# Patient Record
Sex: Male | Born: 1940 | Race: White | Hispanic: No | Smoking: Never smoker
Health system: Southern US, Community
[De-identification: ages and names within clinical notes are randomized; demographics above are authoritative.]

## PROBLEM LIST (undated history)

## (undated) DIAGNOSIS — F0390 Unspecified dementia without behavioral disturbance: Secondary | ICD-10-CM

## (undated) DIAGNOSIS — E785 Hyperlipidemia, unspecified: Secondary | ICD-10-CM

## (undated) DIAGNOSIS — I959 Hypotension, unspecified: Secondary | ICD-10-CM

## (undated) DIAGNOSIS — D649 Anemia, unspecified: Secondary | ICD-10-CM

---

## 2020-01-01 ENCOUNTER — Other Ambulatory Visit: Payer: Self-pay

## 2020-01-01 ENCOUNTER — Emergency Department (HOSPITAL_COMMUNITY): Payer: Medicare Other

## 2020-01-01 ENCOUNTER — Inpatient Hospital Stay (HOSPITAL_COMMUNITY)
Admission: EM | Admit: 2020-01-01 | Discharge: 2020-01-04 | DRG: 871 | Disposition: A | Discharge status: Nursing Home (Includes ICF) | Payer: Medicare Other | Source: Skilled Nursing Facility | Attending: Internal Medicine | Admitting: Internal Medicine

## 2020-01-01 DIAGNOSIS — G309 Alzheimer's disease, unspecified: Secondary | ICD-10-CM | POA: Diagnosis present

## 2020-01-01 DIAGNOSIS — Z66 Do not resuscitate: Secondary | ICD-10-CM | POA: Diagnosis present

## 2020-01-01 DIAGNOSIS — E876 Hypokalemia: Secondary | ICD-10-CM | POA: Diagnosis present

## 2020-01-01 DIAGNOSIS — Z7189 Other specified counseling: Secondary | ICD-10-CM | POA: Diagnosis not present

## 2020-01-01 DIAGNOSIS — J189 Pneumonia, unspecified organism: Secondary | ICD-10-CM | POA: Diagnosis present

## 2020-01-01 DIAGNOSIS — G9341 Metabolic encephalopathy: Secondary | ICD-10-CM | POA: Diagnosis present

## 2020-01-01 DIAGNOSIS — F0390 Unspecified dementia without behavioral disturbance: Secondary | ICD-10-CM | POA: Diagnosis present

## 2020-01-01 DIAGNOSIS — J690 Pneumonitis due to inhalation of food and vomit: Secondary | ICD-10-CM | POA: Diagnosis present

## 2020-01-01 DIAGNOSIS — Z515 Encounter for palliative care: Secondary | ICD-10-CM | POA: Diagnosis not present

## 2020-01-01 DIAGNOSIS — E785 Hyperlipidemia, unspecified: Secondary | ICD-10-CM | POA: Diagnosis present

## 2020-01-01 DIAGNOSIS — F0281 Dementia in other diseases classified elsewhere with behavioral disturbance: Secondary | ICD-10-CM | POA: Diagnosis not present

## 2020-01-01 DIAGNOSIS — F0391 Unspecified dementia with behavioral disturbance: Secondary | ICD-10-CM | POA: Diagnosis not present

## 2020-01-01 DIAGNOSIS — R410 Disorientation, unspecified: Secondary | ICD-10-CM | POA: Diagnosis not present

## 2020-01-01 DIAGNOSIS — A419 Sepsis, unspecified organism: Principal | ICD-10-CM | POA: Diagnosis present

## 2020-01-01 DIAGNOSIS — Z20822 Contact with and (suspected) exposure to covid-19: Secondary | ICD-10-CM | POA: Diagnosis present

## 2020-01-01 DIAGNOSIS — G301 Alzheimer's disease with late onset: Secondary | ICD-10-CM | POA: Diagnosis not present

## 2020-01-01 DIAGNOSIS — G9340 Encephalopathy, unspecified: Secondary | ICD-10-CM | POA: Diagnosis not present

## 2020-01-01 DIAGNOSIS — F02818 Dementia in other diseases classified elsewhere, unspecified severity, with other behavioral disturbance: Secondary | ICD-10-CM

## 2020-01-01 DIAGNOSIS — F0280 Dementia in other diseases classified elsewhere without behavioral disturbance: Secondary | ICD-10-CM | POA: Diagnosis present

## 2020-01-01 DIAGNOSIS — R1312 Dysphagia, oropharyngeal phase: Secondary | ICD-10-CM | POA: Diagnosis present

## 2020-01-01 DIAGNOSIS — G4700 Insomnia, unspecified: Secondary | ICD-10-CM | POA: Diagnosis present

## 2020-01-01 DIAGNOSIS — R509 Fever, unspecified: Secondary | ICD-10-CM

## 2020-01-01 DIAGNOSIS — D649 Anemia, unspecified: Secondary | ICD-10-CM | POA: Diagnosis present

## 2020-01-01 HISTORY — DX: Unspecified dementia, unspecified severity, without behavioral disturbance, psychotic disturbance, mood disturbance, and anxiety: F03.90

## 2020-01-01 LAB — URINALYSIS, ROUTINE W REFLEX MICROSCOPIC
Bilirubin Urine: NEGATIVE
Glucose, UA: NEGATIVE mg/dL
Ketones, ur: 5 mg/dL — AB
Leukocytes,Ua: NEGATIVE
Nitrite: NEGATIVE
Protein, ur: 100 mg/dL — AB
Specific Gravity, Urine: 1.016 (ref 1.005–1.030)
pH: 8 (ref 5.0–8.0)

## 2020-01-01 LAB — COMPREHENSIVE METABOLIC PANEL
ALT: 33 U/L (ref 0–44)
AST: 59 U/L — ABNORMAL HIGH (ref 15–41)
Albumin: 3.6 g/dL (ref 3.5–5.0)
Alkaline Phosphatase: 92 U/L (ref 38–126)
Anion gap: 15 (ref 5–15)
BUN: 21 mg/dL (ref 8–23)
CO2: 26 mmol/L (ref 22–32)
Calcium: 9.2 mg/dL (ref 8.9–10.3)
Chloride: 101 mmol/L (ref 98–111)
Creatinine, Ser: 0.97 mg/dL (ref 0.61–1.24)
GFR calc Af Amer: >60 mL/min (ref >60)
GFR calc non Af Amer: >60 mL/min (ref >60)
Glucose, Bld: 109 mg/dL — ABNORMAL HIGH (ref 70–99)
Potassium: 3.9 mmol/L (ref 3.5–5.1)
Sodium: 142 mmol/L (ref 135–145)
Total Bilirubin: 0.9 mg/dL (ref 0.3–1.2)
Total Protein: 6.7 g/dL (ref 6.5–8.1)

## 2020-01-01 LAB — CBC WITH DIFFERENTIAL/PLATELET
Abs Immature Granulocytes: 0.1 10*3/uL — ABNORMAL HIGH (ref 0.00–0.07)
Basophils Absolute: 0.1 10*3/uL (ref 0.0–0.1)
Basophils Relative: 0 %
Eosinophils Absolute: 0 10*3/uL (ref 0.0–0.5)
Eosinophils Relative: 0 %
HCT: 39 % (ref 39.0–52.0)
Hemoglobin: 12.9 g/dL — ABNORMAL LOW (ref 13.0–17.0)
Immature Granulocytes: 1 %
Lymphocytes Relative: 5 %
Lymphs Abs: 0.8 10*3/uL (ref 0.7–4.0)
MCH: 32.1 pg (ref 26.0–34.0)
MCHC: 33.1 g/dL (ref 30.0–36.0)
MCV: 97 fL (ref 80.0–100.0)
Monocytes Absolute: 1.2 10*3/uL — ABNORMAL HIGH (ref 0.1–1.0)
Monocytes Relative: 7 %
Neutro Abs: 13.7 10*3/uL — ABNORMAL HIGH (ref 1.7–7.7)
Neutrophils Relative %: 87 %
Platelets: 157 10*3/uL (ref 150–400)
RBC: 4.02 MIL/uL — ABNORMAL LOW (ref 4.22–5.81)
RDW: 12.6 % (ref 11.5–15.5)
WBC: 15.8 10*3/uL — ABNORMAL HIGH (ref 4.0–10.5)
nRBC: 0 % (ref 0.0–0.2)

## 2020-01-01 LAB — PROTIME-INR
INR: 1.1 (ref 0.8–1.2)
Prothrombin Time: 13.8 seconds (ref 11.4–15.2)

## 2020-01-01 LAB — RESPIRATORY PANEL BY RT PCR (FLU A&B, COVID)
Influenza A by PCR: NEGATIVE
Influenza B by PCR: NEGATIVE
SARS Coronavirus 2 by RT PCR: NEGATIVE

## 2020-01-01 LAB — LACTIC ACID, PLASMA: Lactic Acid, Venous: 2.1 mmol/L (ref 0.5–1.9)

## 2020-01-01 LAB — APTT: aPTT: 31 seconds (ref 24–36)

## 2020-01-01 MED ORDER — SODIUM CHLORIDE 0.9 % IV SOLN
1.0000 g | Freq: Every day | INTRAVENOUS | Status: DC
  Administered 2020-01-01: 1 g via INTRAVENOUS
  Filled 2020-01-01: qty 10

## 2020-01-01 MED ORDER — LACTATED RINGERS IV BOLUS (SEPSIS)
500.0000 mL | Freq: Once | INTRAVENOUS | Status: AC
  Administered 2020-01-02: 500 mL via INTRAVENOUS

## 2020-01-01 MED ORDER — SODIUM CHLORIDE 0.9 % IV SOLN
500.0000 mg | Freq: Once | INTRAVENOUS | Status: AC
  Administered 2020-01-01: 500 mg via INTRAVENOUS
  Filled 2020-01-01: qty 500

## 2020-01-01 MED ORDER — LACTATED RINGERS IV BOLUS (SEPSIS)
1000.0000 mL | Freq: Once | INTRAVENOUS | Status: AC
  Administered 2020-01-01: 1000 mL via INTRAVENOUS

## 2020-01-01 MED ORDER — IBUPROFEN 400 MG PO TABS
600.0000 mg | ORAL_TABLET | Freq: Once | ORAL | Status: DC
  Filled 2020-01-01: qty 1

## 2020-01-01 MED ORDER — SODIUM CHLORIDE 0.9 % IV BOLUS: 1000.0000 mL | Freq: Once | INTRAVENOUS | Status: DC

## 2020-01-01 MED ORDER — ACETAMINOPHEN 325 MG RE SUPP
325.0000 mg | Freq: Once | RECTAL | Status: AC
  Administered 2020-01-01: 325 mg via RECTAL
  Filled 2020-01-01: qty 1

## 2020-01-01 MED ORDER — ACETAMINOPHEN 650 MG RE SUPP
650.0000 mg | Freq: Once | RECTAL | Status: AC
  Administered 2020-01-01: 22:00:00 650 mg via RECTAL
  Filled 2020-01-01: qty 1

## 2020-01-01 NOTE — ED Provider Notes (Signed)
[facility] EMERGENCY DEPARTMENT Provider Note   CSN: [id number] Arrival date & time: 01/01/20  2111     History Chief Complaint  Patient presents with  . Altered Mental Status  . Fever    Raymond Snyder is a 79 y.o. male.  Pt presents to the ED today from the SNF with fever and AMS.  Pt has a hx of dementia and is unable to given any hx.  EMS said the SNF did not give much info.    I called the SNF [facility]) and spoke to his nurse.  She said he c/o feeling tired this am.  Around 2015, the nurse checked on him and he had a fever.  No meds for his fever. The pt was tested for covid yesterday, but results are not back.  Pt's nurse thinks he's had his Covid vaccine, but is not sure.  Pt's daughter is here and said he's NOT had his Covid vaccine.  Pt's daughter said he is normally very talkative, but is not speaking now.        No past medical history on file.  There are no problems to display for this patient.  Past Medical History:  Diagnosis Date  . Alzheimer's disease (HCC)  . Constipation  . Depression with anxiety  . Hallucinations  . Insomnia    Surgery: . ABDOMINAL AORTIC ANEURYSM REPAIR     No family history on file.  Social History   Tobacco Use  . Smoking status: Not on file  Substance Use Topics  . Alcohol use: Not on file  . Drug use: Not on file   Former smoker Lives in ALF  Home Medications Prior to Admission medications   Not on File    Allergies    Patient has no allergy information on record. nkda  Review of Systems   Review of Systems  Unable to perform ROS: Dementia    Physical Exam Updated Vital Signs BP (!) 165/82   Pulse 89   Temp (!) 101.9 F (38.8 C) (Rectal)   Resp 20   Ht 5\' 10"  (1.778 m)   Wt 79.8 kg   SpO2 96%   BMI 25.25 kg/m   Physical Exam Vitals and nursing note reviewed.  HENT:     Head: Normocephalic and atraumatic.     Right Ear: External ear normal.     Left Ear: External  ear normal.     Nose: Nose normal.     Mouth/Throat:     Mouth: Mucous membranes are dry.  Eyes:     Extraocular Movements: Extraocular movements intact.     Conjunctiva/sclera: Conjunctivae normal.     Pupils: Pupils are equal, round, and reactive to light.  Cardiovascular:     Rate and Rhythm: Normal rate and regular rhythm.     Pulses: Normal pulses.     Heart sounds: Normal heart sounds.  Pulmonary:     Effort: Pulmonary effort is normal.     Breath sounds: Normal breath sounds.  Abdominal:     General: Abdomen is flat. Bowel sounds are normal.     Palpations: Abdomen is soft.  Musculoskeletal:        General: Normal range of motion.     Cervical back: Normal range of motion and neck supple.  Skin:    General: Skin is warm.     Capillary Refill: Capillary refill takes less than 2 seconds.  Neurological:     Mental Status: He is disoriented.  Comments: Pt will not answer any questions or follow any commands.  He is moving all 4 extremities.  Psychiatric:        Mood and Affect: Mood is anxious.     ED Results / Procedures / Treatments   Labs (all labs ordered are listed, but only abnormal results are displayed) Labs Reviewed  LACTIC ACID, PLASMA - Abnormal; Notable for the following components:      Result Value   Lactic Acid, Venous 2.1 (*)    All other components within normal limits  COMPREHENSIVE METABOLIC PANEL - Abnormal; Notable for the following components:   Glucose, Bld 109 (*)    AST 59 (*)    All other components within normal limits  CBC WITH DIFFERENTIAL/PLATELET - Abnormal; Notable for the following components:   WBC 15.8 (*)    RBC 4.02 (*)    Hemoglobin 12.9 (*)    Neutro Abs 13.7 (*)    Monocytes Absolute 1.2 (*)    Abs Immature Granulocytes 0.10 (*)    All other components within normal limits  URINALYSIS, ROUTINE W REFLEX MICROSCOPIC - Abnormal; Notable for the following components:   Hgb urine dipstick MODERATE (*)    Ketones, ur 5 (*)     Protein, ur 100 (*)    Bacteria, UA RARE (*)    All other components within normal limits  RESPIRATORY PANEL BY RT PCR (FLU A&B, COVID)  CULTURE, BLOOD (ROUTINE X 2)  CULTURE, BLOOD (ROUTINE X 2)  URINE CULTURE  APTT  PROTIME-INR  LACTIC ACID, PLASMA    EKG EKG Interpretation  Date/Time:  Wednesday January 01 2020 21:15:40 EDT Ventricular Rate:  91 PR Interval:    QRS Duration: 154 QT Interval:  425 QTC Calculation: 523 R Axis:   -77 Text Interpretation: Sinus rhythm Probable left atrial enlargement RBBB and LAFB Left ventricular hypertrophy No old tracing to compare Confirmed by Isla Pence [phone]) on 01/01/2020 11:15:03 PM   Radiology DG Chest Port 1 View  Result Date: 01/01/2020 CLINICAL DATA:  Fever EXAM: PORTABLE CHEST 1 VIEW COMPARISON:  None. FINDINGS: Left lung grossly clear. Airspace disease at the right middle lobe and right base. Normal heart size. No pneumothorax IMPRESSION: Airspace disease at the right middle lobe and right base concerning for pneumonia or aspiration. Radiographic follow-up to resolution is recommended Electronically Signed   By: Donavan Foil M.D.   On: 01/01/2020 22:01    Procedures Procedures (including critical care time)  Medications Ordered in ED Medications  cefTRIAXone (ROCEPHIN) 1 g in sodium chloride 0.9 % 100 mL IVPB (0 g Intravenous Stopped 01/01/20 2304)  azithromycin (ZITHROMAX) 500 mg in sodium chloride 0.9 % 250 mL IVPB (500 mg Intravenous New Bag/Given 01/01/20 2304)  lactated ringers bolus 1,000 mL (has no administration in time range)    And  lactated ringers bolus 1,000 mL (has no administration in time range)    And  lactated ringers bolus 500 mL (has no administration in time range)  ibuprofen (ADVIL) tablet 600 mg (has no administration in time range)  acetaminophen (TYLENOL) suppository 650 mg (650 mg Rectal Given 01/01/20 2211)  acetaminophen (TYLENOL) suppository 325 mg (325 mg Rectal Given 01/01/20 2211)    ED  Course  I have reviewed the triage vital signs and the nursing notes.  Pertinent labs & imaging results that were available during my care of the patient were reviewed by me and considered in my medical decision making (see chart for details).    MDM  Rules/Calculators/A&P                      Code sepsis ordered due to fever, rr, and delirium.  Pt does have pna.  He was given rocephin and zithromax.    Pt given sepsis fluids.  Pt is still very confused.  Pt d/w Dr. Toniann Fail (triad) for admission.  CRITICAL CARE Performed by: Jacalyn Lefevre   Total critical care time: 30 minutes  Critical care time was exclusive of separately billable procedures and treating other patients.  Critical care was necessary to treat or prevent imminent or life-threatening deterioration.  Critical care was time spent personally by me on the following activities: development of treatment plan with patient and/or surrogate as well as nursing, discussions with consultants, evaluation of patient's response to treatment, examination of patient, obtaining history from patient or surrogate, ordering and performing treatments and interventions, ordering and review of laboratory studies, ordering and review of radiographic studies, pulse oximetry and re-evaluation of patient's condition.  Ketih Goodie was evaluated in Emergency Department on 01/01/2020 for the symptoms described in the history of present illness. He was evaluated in the context of the global COVID-19 pandemic, which necessitated consideration that the patient might be at risk for infection with the SARS-CoV-2 virus that causes COVID-19. Institutional protocols and algorithms that pertain to the evaluation of patients at risk for COVID-19 are in a state of rapid change based on information released by regulatory bodies including the CDC and federal and state organizations. These policies and algorithms were followed during the patient's care in the ED.    Final Clinical Impression(s) / ED Diagnoses Final diagnoses:  Community acquired pneumonia of right lower lobe of lung  Acute delirium  COVID-19 virus not detected  Fever in adult    Rx / DC Orders ED Discharge Orders    None       Jacalyn Lefevre, MD 01/01/20 2340

## 2020-01-01 NOTE — ED Triage Notes (Signed)
Pt BIB [facility] from [facility] c/o fevers and increased AMS. Pt altered at baseline with minimal communication. HX dementia. Currently A&Ox0. unknow LKN.   EMS VS T 101.3 HR 97 BP 171/93 SpO2 94 RA CBG 115

## 2020-01-02 ENCOUNTER — Encounter (HOSPITAL_COMMUNITY): Payer: Self-pay | Admitting: Internal Medicine

## 2020-01-02 ENCOUNTER — Inpatient Hospital Stay (HOSPITAL_COMMUNITY): Payer: Medicare Other

## 2020-01-02 DIAGNOSIS — J189 Pneumonia, unspecified organism: Secondary | ICD-10-CM

## 2020-01-02 DIAGNOSIS — G9340 Encephalopathy, unspecified: Secondary | ICD-10-CM

## 2020-01-02 DIAGNOSIS — F0391 Unspecified dementia with behavioral disturbance: Secondary | ICD-10-CM

## 2020-01-02 DIAGNOSIS — A419 Sepsis, unspecified organism: Principal | ICD-10-CM

## 2020-01-02 LAB — CBC WITH DIFFERENTIAL/PLATELET
Abs Immature Granulocytes: 0.09 K/uL — ABNORMAL HIGH (ref 0.00–0.07)
Basophils Absolute: 0 K/uL (ref 0.0–0.1)
Basophils Relative: 0 %
Eosinophils Absolute: 0 K/uL (ref 0.0–0.5)
Eosinophils Relative: 0 %
HCT: 34.1 % — ABNORMAL LOW (ref 39.0–52.0)
Hemoglobin: 11.6 g/dL — ABNORMAL LOW (ref 13.0–17.0)
Immature Granulocytes: 1 %
Lymphocytes Relative: 4 %
Lymphs Abs: 0.6 K/uL — ABNORMAL LOW (ref 0.7–4.0)
MCH: 32.5 pg (ref 26.0–34.0)
MCHC: 34 g/dL (ref 30.0–36.0)
MCV: 95.5 fL (ref 80.0–100.0)
Monocytes Absolute: 0.9 K/uL (ref 0.1–1.0)
Monocytes Relative: 7 %
Neutro Abs: 12.8 K/uL — ABNORMAL HIGH (ref 1.7–7.7)
Neutrophils Relative %: 88 %
Platelets: 131 K/uL — ABNORMAL LOW (ref 150–400)
RBC: 3.57 MIL/uL — ABNORMAL LOW (ref 4.22–5.81)
RDW: 12.4 % (ref 11.5–15.5)
WBC: 14.4 K/uL — ABNORMAL HIGH (ref 4.0–10.5)
nRBC: 0 % (ref 0.0–0.2)

## 2020-01-02 LAB — COMPREHENSIVE METABOLIC PANEL WITH GFR
ALT: 29 U/L (ref 0–44)
AST: 54 U/L — ABNORMAL HIGH (ref 15–41)
Albumin: 2.8 g/dL — ABNORMAL LOW (ref 3.5–5.0)
Alkaline Phosphatase: 85 U/L (ref 38–126)
Anion gap: 12 (ref 5–15)
BUN: 16 mg/dL (ref 8–23)
CO2: 27 mmol/L (ref 22–32)
Calcium: 8.6 mg/dL — ABNORMAL LOW (ref 8.9–10.3)
Chloride: 101 mmol/L (ref 98–111)
Creatinine, Ser: 0.84 mg/dL (ref 0.61–1.24)
GFR calc Af Amer: >60 mL/min
GFR calc non Af Amer: >60 mL/min
Glucose, Bld: 107 mg/dL — ABNORMAL HIGH (ref 70–99)
Potassium: 3.4 mmol/L — ABNORMAL LOW (ref 3.5–5.1)
Sodium: 140 mmol/L (ref 135–145)
Total Bilirubin: 0.7 mg/dL (ref 0.3–1.2)
Total Protein: 5.5 g/dL — ABNORMAL LOW (ref 6.5–8.1)

## 2020-01-02 LAB — LACTIC ACID, PLASMA
Lactic Acid, Venous: 1.6 mmol/L (ref 0.5–1.9)
Lactic Acid, Venous: 1 mmol/L (ref 0.5–1.9)

## 2020-01-02 LAB — STREP PNEUMONIAE URINARY ANTIGEN: Strep Pneumo Urinary Antigen: NEGATIVE

## 2020-01-02 LAB — GLUCOSE, CAPILLARY
Glucose-Capillary: 93 mg/dL (ref 70–99)
Glucose-Capillary: 106 mg/dL — ABNORMAL HIGH (ref 70–99)

## 2020-01-02 MED ORDER — ORAL CARE MOUTH RINSE
15.0000 mL | Freq: Two times a day (BID) | OROMUCOSAL | Status: DC
  Administered 2020-01-03 – 2020-01-04 (×3): 15 mL via OROMUCOSAL

## 2020-01-02 MED ORDER — HALOPERIDOL 5 MG PO TABS
7.5000 mg | ORAL_TABLET | Freq: Every day | ORAL | Status: DC
  Administered 2020-01-02 – 2020-01-03 (×2): 7.5 mg via ORAL
  Filled 2020-01-02 (×4): qty 1

## 2020-01-02 MED ORDER — ACETAMINOPHEN 650 MG RE SUPP: 650.0000 mg | Freq: Four times a day (QID) | RECTAL | Status: DC | PRN

## 2020-01-02 MED ORDER — CHLORHEXIDINE GLUCONATE 0.12 % MT SOLN
15.0000 mL | Freq: Two times a day (BID) | OROMUCOSAL | Status: DC
  Administered 2020-01-02 – 2020-01-04 (×4): 15 mL via OROMUCOSAL
  Filled 2020-01-02 (×4): qty 15

## 2020-01-02 MED ORDER — SODIUM CHLORIDE 0.9 % IV SOLN: INTRAVENOUS | Status: DC

## 2020-01-02 MED ORDER — ONDANSETRON HCL 4 MG/2ML IJ SOLN: 4.0000 mg | Freq: Four times a day (QID) | INTRAMUSCULAR | Status: DC | PRN

## 2020-01-02 MED ORDER — ASPIRIN EC 81 MG PO TBEC
81.0000 mg | DELAYED_RELEASE_TABLET | Freq: Every day | ORAL | Status: DC
  Administered 2020-01-02 – 2020-01-04 (×3): 81 mg via ORAL
  Filled 2020-01-02 (×3): qty 1

## 2020-01-02 MED ORDER — ATORVASTATIN CALCIUM 80 MG PO TABS
80.0000 mg | ORAL_TABLET | Freq: Every day | ORAL | Status: DC
  Administered 2020-01-02 – 2020-01-03 (×2): 80 mg via ORAL
  Filled 2020-01-02 (×2): qty 1

## 2020-01-02 MED ORDER — POTASSIUM CHLORIDE CRYS ER 20 MEQ PO TBCR
40.0000 meq | EXTENDED_RELEASE_TABLET | Freq: Once | ORAL | Status: AC
  Administered 2020-01-02: 40 meq via ORAL
  Filled 2020-01-02: qty 2

## 2020-01-02 MED ORDER — CLONAZEPAM 0.25 MG PO TBDP
0.2500 mg | ORAL_TABLET | Freq: Every day | ORAL | Status: DC
  Administered 2020-01-02 – 2020-01-03 (×2): 0.25 mg via ORAL
  Filled 2020-01-02 (×2): qty 1

## 2020-01-02 MED ORDER — MEMANTINE HCL 10 MG PO TABS
10.0000 mg | ORAL_TABLET | Freq: Two times a day (BID) | ORAL | Status: DC
  Administered 2020-01-02 – 2020-01-04 (×5): 10 mg via ORAL
  Filled 2020-01-02 (×5): qty 1

## 2020-01-02 MED ORDER — HALOPERIDOL 5 MG PO TABS: 5.0000 mg | ORAL_TABLET | ORAL | Status: DC

## 2020-01-02 MED ORDER — CLONAZEPAM 0.5 MG PO TABS: 0.2500 mg | ORAL_TABLET | Freq: Every day | ORAL | Status: DC

## 2020-01-02 MED ORDER — SODIUM CHLORIDE 0.9 % IV SOLN
2.0000 g | Freq: Every day | INTRAVENOUS | Status: DC
  Administered 2020-01-02 – 2020-01-03 (×2): 2 g via INTRAVENOUS
  Filled 2020-01-02 (×2): qty 20

## 2020-01-02 MED ORDER — ONDANSETRON HCL 4 MG PO TABS: 4.0000 mg | ORAL_TABLET | Freq: Four times a day (QID) | ORAL | Status: DC | PRN

## 2020-01-02 MED ORDER — HALOPERIDOL LACTATE 5 MG/ML IJ SOLN
1.0000 mg | Freq: Four times a day (QID) | INTRAMUSCULAR | Status: DC | PRN
  Administered 2020-01-02: 1 mg via INTRAVENOUS
  Filled 2020-01-02 (×2): qty 1

## 2020-01-02 MED ORDER — LORAZEPAM 2 MG/ML IJ SOLN: 0.5000 mg | Freq: Four times a day (QID) | INTRAMUSCULAR | Status: DC | PRN

## 2020-01-02 MED ORDER — HALOPERIDOL 5 MG PO TABS
5.0000 mg | ORAL_TABLET | Freq: Two times a day (BID) | ORAL | Status: DC
  Administered 2020-01-02 – 2020-01-04 (×4): 5 mg via ORAL
  Filled 2020-01-02 (×5): qty 1

## 2020-01-02 MED ORDER — TRAZODONE HCL 50 MG PO TABS
100.0000 mg | ORAL_TABLET | Freq: Every day | ORAL | Status: DC
  Administered 2020-01-02 – 2020-01-03 (×2): 100 mg via ORAL
  Filled 2020-01-02 (×2): qty 2

## 2020-01-02 MED ORDER — HALOPERIDOL LACTATE 5 MG/ML IJ SOLN
1.0000 mg | Freq: Once | INTRAMUSCULAR | Status: AC
  Administered 2020-01-02: 1 mg via INTRAVENOUS
  Filled 2020-01-02: qty 1

## 2020-01-02 MED ORDER — CARBAMAZEPINE 200 MG PO TABS
200.0000 mg | ORAL_TABLET | Freq: Three times a day (TID) | ORAL | Status: DC
  Administered 2020-01-02 – 2020-01-04 (×6): 200 mg via ORAL
  Filled 2020-01-02 (×9): qty 1

## 2020-01-02 MED ORDER — ENOXAPARIN SODIUM 40 MG/0.4ML ~~LOC~~ SOLN
40.0000 mg | SUBCUTANEOUS | Status: DC
  Administered 2020-01-02 – 2020-01-04 (×3): 40 mg via SUBCUTANEOUS
  Filled 2020-01-02 (×3): qty 0.4

## 2020-01-02 MED ORDER — SODIUM CHLORIDE 0.9 % IV SOLN
500.0000 mg | Freq: Every day | INTRAVENOUS | Status: DC
  Administered 2020-01-02 – 2020-01-03 (×2): 500 mg via INTRAVENOUS
  Filled 2020-01-02 (×3): qty 500

## 2020-01-02 MED ORDER — ACETAMINOPHEN 325 MG PO TABS
650.0000 mg | ORAL_TABLET | Freq: Four times a day (QID) | ORAL | Status: DC | PRN
  Administered 2020-01-02: 650 mg via ORAL
  Filled 2020-01-02: qty 2

## 2020-01-02 MED ORDER — OLANZAPINE 2.5 MG PO TABS
2.5000 mg | ORAL_TABLET | Freq: Two times a day (BID) | ORAL | Status: DC | PRN
  Administered 2020-01-03: 2.5 mg via ORAL
  Filled 2020-01-02 (×3): qty 1

## 2020-01-02 MED ORDER — HYDRALAZINE HCL 20 MG/ML IJ SOLN
5.0000 mg | Freq: Four times a day (QID) | INTRAMUSCULAR | Status: DC | PRN
  Administered 2020-01-02 (×2): 5 mg via INTRAVENOUS
  Filled 2020-01-02: qty 1

## 2020-01-02 NOTE — ED Notes (Signed)
Safety mittens placed on pt. Family at bedside

## 2020-01-02 NOTE — ED Notes (Signed)
Please call patient's daughter Lorina Rabon) on admission to floor, with updates,  and with any questions.

## 2020-01-02 NOTE — Evaluation (Signed)
Physical Therapy Evaluation and Discharge Patient Details Name: Raymond Snyder MRN: [id number] DOB: 1940/11/16 Today's Date: 01/02/2020   History of Present Illness  79 y.o. male presents to ED from [facility] with fever and AMS. Known history of dementia and hyperlipidemia. Chest x-ray shows infiltrates concerning for pneumonia. Admitted 01/01/20 for treatment of CAP and acute encephalopathy  Clinical Impression  Pt is from [facility] and has advanced dementia limiting his ability to participate in therapy. Pt follows 10% of single step commands with hand over hand assist. Pt limited in safe mobility by cognition, R knee pain and decreased balance. Pt requires maximal multimodal cuing for all mobility, requiring modA for bed mobility, minAx2-total A for sit>stand and modAx2 for slow festinating step to gait with lead of stronger L LE. Given pt advanced dementia and inability to participate in therapy PT recommends no PT follow up and return to [facility] when medically appropriate. PT signing off.     Follow Up Recommendations  No PT follow up           Precautions / Restrictions Precautions Precautions: Fall Precaution Comments: sitter present Restrictions Weight Bearing Restrictions: No      Mobility  Bed Mobility Overal bed mobility: Needs Assistance Bed Mobility: Supine to Sit;Sit to Supine     Supine to sit: Mod assist Sit to supine: Min assist   General bed mobility comments: modA for movement of LE to EoB once there pt understanding of getting up and able to bring trunk to upright without assist, min A for trunk management to bed once started to supine pt able to bring LE into bed without assist  Transfers Overall transfer level: Needs assistance   Transfers: Sit to/from Stand Sit to Stand: Min assist;+2 physical assistance;Total assist         General transfer comment: minAx2 for hand over hand leading pt into standing, total Ax2 for  bringing pt back into sitting, despite hand over hand elevated surface, one step commands   Ambulation/Gait Ambulation/Gait assistance: Mod assist;+2 physical assistance Gait Distance (Feet): 20 Feet Assistive device: 2 person hand held assist Gait Pattern/deviations: Step-to pattern;Decreased step length - right;Decreased weight shift to right;Festinating Gait velocity: slowed Gait velocity interpretation: <1.31 ft/sec, indicative of household ambulator General Gait Details: modA for steadying with step to pattern L foot lead and R foot follow, likely due to R knee pain, initiated with very small festinating gait and progressed to longer step length with distance, very small steps to turn around      Balance Overall balance assessment: Needs assistance Sitting-balance support: Feet supported;No upper extremity supported Sitting balance-Leahy Scale: Fair     Standing balance support: Bilateral upper extremity supported;During functional activity Standing balance-Leahy Scale: Poor                               Pertinent Vitals/Pain Pain Assessment: Faces Faces Pain Scale: Hurts little more Pain Location: R knee after ambulation  Pain Descriptors / Indicators: Grimacing;Guarding Pain Intervention(s): Limited activity within patient's tolerance;Monitored during session;Repositioned    Home Living Family/patient expects to be discharged to:: Skilled nursing facility                 Additional Comments: [facility]    Prior Function Level of Independence: Needs assistance         Comments: unable to answer questions  Extremity/Trunk Assessment   Upper Extremity Assessment Upper Extremity Assessment: Difficult to assess due to impaired cognition    Lower Extremity Assessment Lower Extremity Assessment: Difficult to assess due to impaired cognition;RLE deficits/detail RLE Deficits / Details: evidence of TKA, ROM WFL, however  decreased weighbearing with ambulation        Communication   Communication: Expressive difficulties  Cognition Arousal/Alertness: Awake/alert Behavior During Therapy: Agitated;Anxious;Impulsive;Restless;Flat affect Overall Cognitive Status: History of cognitive impairments - at baseline                                 General Comments: resident of Memory Care, dementia at baseline      General Comments General comments (skin integrity, edema, etc.): pt has pulled off all telemetry, but not symptomatic with ambulation         Assessment/Plan    PT Assessment Patent does not need any further PT services;Patient needs continued PT services  PT Problem List Decreased coordination;Decreased mobility;Decreased cognition;Pain        AM-PAC PT "6 Clicks" Mobility  Outcome Measure Help needed turning from your back to your side while in a flat bed without using bedrails?: None Help needed moving from lying on your back to sitting on the side of a flat bed without using bedrails?: A Little Help needed moving to and from a bed to a chair (including a wheelchair)?: A Lot Help needed standing up from a chair using your arms (e.g., wheelchair or bedside chair)?: A Lot Help needed to walk in hospital room?: A Lot Help needed climbing 3-5 steps with a railing? : Total 6 Click Score: 14    End of Session Equipment Utilized During Treatment: Gait belt Activity Tolerance: No increased pain Patient left: in bed;with call bell/phone within reach;with nursing/sitter in room;with restraints reapplied Nurse Communication: Mobility status PT Visit Diagnosis: Unsteadiness on feet (R26.81);Other abnormalities of gait and mobility (R26.89);Muscle weakness (generalized) (M62.81);Difficulty in walking, not elsewhere classified (R26.2);Other symptoms and signs involving the nervous system (R29.898);Pain Pain - Right/Left: Right Pain - part of body: Knee    Time: 4580-9983 PT Time  Calculation (min) (ACUTE ONLY): 25 min   Charges:   PT Evaluation $PT Eval Moderate Complexity: 1 Mod PT Treatments $Gait Training: 8-22 mins        Levester Waldridge B. Migdalia Dk PT, DPT Acute Rehabilitation Services Pager [phone] Office [phone]   [place] 01/02/2020, 11:11 AM

## 2020-01-02 NOTE — Progress Notes (Signed)
PROGRESS NOTE        PATIENT DETAILS Name: Raymond Snyder Age: 79 y.o. Sex: male Date of Birth: 1941/09/20 Admit Date: 01/01/2020 Admitting Physician Eduard Clos, MD EXH:[id number], Pcp Not In  Brief Narrative: Patient is a 79 y.o. male with past medical history of dementia, HLD-brought from his memory care unit for fever and confusion-found to have aspiration pneumonitis.  Significant events: 3/18>> admit for aspiration pneumonia  Antimicrobial therapy: Rocephin: 3/17>> Zithromax: 3/17>>  Microbiology data: 3/17>> blood cultures: Negative 3/17>> urine culture: Pending 3/17>> Covid/influenza negative.  Procedures : None  Consults: None  DVT Prophylaxis : Prophylactic Lovenox   Subjective: Very confused-attempting to get out of bed.  Able to tell me his name but otherwise is confused.  Assessment/Plan: Sepsis secondary to aspiration pneumonia (POA): Sepsis physiology resolving-blood cultures negative so far-continue IV antimicrobial therapy-follow cultures.    Dysphagia: Probably secondary to dementia-keep n.p.o.-SLP evaluation ordered.  Dementia with superimposed acute metabolic encephalopathy secondary to sepsis/aspiration pneumonia: Remains pleasantly confused-avoid excessive sedation as at risk for recurrent episodes of aspiration.  Sitter ordered.  Haldol as needed for severe agitation-or if attempt to get out of bed.  Will expect some amount of delirium during this hospital stay.  Hypokalemia: Replete and recheck  Other issues:  Medication reconciliation yet to be performed-have notified pharmacy. Pulling out IV lines/telemetry leads-DC telemetry-saline lock all IV fluids for now.  Diet: Diet Order            Diet NPO time specified  Diet effective now               Code Status: DNR-reconfirmed with daughter on 3/18  Family Communication: Daugther over the phone 3/18  Disposition Plan: ALF when ready for  discharge  Barriers to Discharge:  Sepsis with Asp PNA-requiring IV Antibiotics  Antimicrobial agents: Anti-infectives (From admission, onward)   Start     Dose/Rate Route Frequency Ordered Stop   01/02/20 2200  cefTRIAXone (ROCEPHIN) 2 g in sodium chloride 0.9 % 100 mL IVPB     2 g 200 mL/hr over 30 Minutes Intravenous Daily at bedtime 01/02/20 0216 01/07/20 2159   01/02/20 2200  azithromycin (ZITHROMAX) 500 mg in sodium chloride 0.9 % 250 mL IVPB     500 mg 250 mL/hr over 60 Minutes Intravenous Daily at bedtime 01/02/20 0216 01/07/20 2159   01/01/20 2215  azithromycin (ZITHROMAX) 500 mg in sodium chloride 0.9 % 250 mL IVPB     500 mg 250 mL/hr over 60 Minutes Intravenous  Once 01/01/20 2207 01/02/20 0100   01/01/20 2200  cefTRIAXone (ROCEPHIN) 1 g in sodium chloride 0.9 % 100 mL IVPB  Status:  Discontinued     1 g 200 mL/hr over 30 Minutes Intravenous Daily at bedtime 01/01/20 2121 01/02/20 0216       Time spent: 25-minutes-Greater than 50% of this time was spent in counseling, explanation of diagnosis, planning of further management, and coordination of care.  MEDICATIONS: Scheduled Meds: . enoxaparin (LOVENOX) injection  40 mg Subcutaneous Q24H   Continuous Infusions: . azithromycin    . cefTRIAXone (ROCEPHIN)  IV     PRN Meds:.acetaminophen **OR** acetaminophen, haloperidol lactate, ondansetron **OR** ondansetron (ZOFRAN) IV   PHYSICAL EXAM: Vital signs: Vitals:   01/01/20 2300 01/02/20 0130 01/02/20 0154 01/02/20 0415  BP: (!) 165/90 (!) 143/65    Pulse: 85 70  Resp: 20 15    Temp:   99.6 F (37.6 C) 98.9 F (37.2 C)  TempSrc:   Axillary Axillary  SpO2: 98% 96%    Weight:      Height:       Filed Weights   01/01/20 2138  Weight: 79.8 kg   Body mass index is 25.25 kg/m.   Gen Exam:Alert -confused-but was able to tell me his name-speech was clear. HEENT:atraumatic, normocephalic Chest: B/L clear to auscultation anteriorly CVS:S1S2  regular Abdomen:soft non tender, non distended Extremities:no edema Neurology: Moving all 4 extremities-attempting to get out of bed. Skin: no rash  I have personally reviewed following labs and imaging studies  LABORATORY DATA: CBC: Recent Labs  Lab 01/01/20 2135 01/02/20 0535  WBC 15.8* 14.4*  NEUTROABS 13.7* 12.8*  HGB 12.9* 11.6*  HCT 39.0 34.1*  MCV 97.0 95.5  PLT 157 131*    Basic Metabolic Panel: Recent Labs  Lab 01/01/20 2135 01/02/20 0535  NA 142 140  K 3.9 3.4*  CL 101 101  CO2 26 27  GLUCOSE 109* 107*  BUN 21 16  CREATININE 0.97 0.84  CALCIUM 9.2 8.6*    GFR: Estimated Creatinine Clearance: 77.2 mL/min (by C-G formula based on SCr of 0.84 mg/dL).  Liver Function Tests: Recent Labs  Lab 01/01/20 2135 01/02/20 0535  AST 59* 54*  ALT 33 29  ALKPHOS 92 85  BILITOT 0.9 0.7  PROT 6.7 5.5*  ALBUMIN 3.6 2.8*   No results for input(s): LIPASE, AMYLASE in the last 168 hours. No results for input(s): AMMONIA in the last 168 hours.  Coagulation Profile: Recent Labs  Lab 01/01/20 2135  INR 1.1    Cardiac Enzymes: No results for input(s): CKTOTAL, CKMB, CKMBINDEX, TROPONINI in the last 168 hours.  BNP (last 3 results) No results for input(s): PROBNP in the last 8760 hours.  Lipid Profile: No results for input(s): CHOL, HDL, LDLCALC, TRIG, CHOLHDL, LDLDIRECT in the last 72 hours.  Thyroid Function Tests: No results for input(s): TSH, T4TOTAL, FREET4, T3FREE, THYROIDAB in the last 72 hours.  Anemia Panel: No results for input(s): VITAMINB12, FOLATE, FERRITIN, TIBC, IRON, RETICCTPCT in the last 72 hours.  Urine analysis:    Component Value Date/Time   COLORURINE YELLOW 01/01/2020 2121   APPEARANCEUR CLEAR 01/01/2020 2121   LABSPEC 1.016 01/01/2020 2121   PHURINE 8.0 01/01/2020 2121   GLUCOSEU NEGATIVE 01/01/2020 2121   HGBUR MODERATE (A) 01/01/2020 2121   BILIRUBINUR NEGATIVE 01/01/2020 2121   KETONESUR 5 (A) 01/01/2020 2121    PROTEINUR 100 (A) 01/01/2020 2121   NITRITE NEGATIVE 01/01/2020 2121   LEUKOCYTESUR NEGATIVE 01/01/2020 2121    Sepsis Labs: Lactic Acid, Venous    Component Value Date/Time   LATICACIDVEN 1.0 01/02/2020 0535    MICROBIOLOGY: Recent Results (from the past 240 hour(s))  Blood Culture (routine x 2)     Status: None (Preliminary result)   Collection Time: 01/01/20  9:35 PM   Specimen: BLOOD LEFT FOREARM  Result Value Ref Range Status   Specimen Description BLOOD LEFT FOREARM  Final   Special Requests   Final    BOTTLES DRAWN AEROBIC AND ANAEROBIC Blood Culture adequate volume   Culture   Final    NO GROWTH < 12 HOURS Performed at [facility], [address] [place], [place] [address]    Report Status PENDING  Incomplete  Blood Culture (routine x 2)     Status: None (Preliminary result)   Collection Time: 01/01/20  9:35 PM   Specimen: BLOOD  Result Value Ref Range Status   Specimen Description BLOOD LEFT ANTECUBITAL  Final   Special Requests   Final    BOTTLES DRAWN AEROBIC AND ANAEROBIC Blood Culture adequate volume   Culture   Final    NO GROWTH < 12 HOURS Performed at [facility], [address] [place], [place] [address]    Report Status PENDING  Incomplete  Respiratory Panel by RT PCR (Flu A&B, Covid) - Nasopharyngeal Swab     Status: None   Collection Time: 01/01/20  9:35 PM   Specimen: Nasopharyngeal Swab  Result Value Ref Range Status   SARS Coronavirus 2 by RT PCR NEGATIVE NEGATIVE Final    Comment: (NOTE) SARS-CoV-2 target nucleic acids are NOT DETECTED. The SARS-CoV-2 RNA is generally detectable in upper respiratoy specimens during the acute phase of infection. The lowest concentration of SARS-CoV-2 viral copies this assay can detect is 131 copies/mL. A negative result does not preclude SARS-Cov-2 infection and should not be used as the sole basis for treatment or other patient management decisions. A negative result may occur with  improper  specimen collection/handling, submission of specimen other than nasopharyngeal swab, presence of viral mutation(s) within the areas targeted by this assay, and inadequate number of viral copies (<131 copies/mL). A negative result must be combined with clinical observations, patient history, and epidemiological information. The expected result is Negative. Fact Sheet for Patients:  https://www.moore.com/ Fact Sheet for Healthcare Providers:  https://www.young.biz/ This test is not yet ap proved or cleared by the [place] FDA and  has been authorized for detection and/or diagnosis of SARS-CoV-2 by FDA under an Emergency Use Authorization (EUA). This EUA will remain  in effect (meaning this test can be used) for the duration of the COVID-19 declaration under Section 564(b)(1) of the Act, 21 U.S.C. section 360bbb-3(b)(1), unless the authorization is terminated or revoked sooner.    Influenza A by PCR NEGATIVE NEGATIVE Final   Influenza B by PCR NEGATIVE NEGATIVE Final    Comment: (NOTE) The Xpert Xpress SARS-CoV-2/FLU/RSV assay is intended as an aid in  the diagnosis of influenza from Nasopharyngeal swab specimens and  should not be used as a sole basis for treatment. Nasal washings and  aspirates are unacceptable for Xpert Xpress SARS-CoV-2/FLU/RSV  testing. Fact Sheet for Patients: https://www.moore.com/ Fact Sheet for Healthcare Providers: https://www.young.biz/ This test is not yet approved or cleared by the [place] FDA and  has been authorized for detection and/or diagnosis of SARS-CoV-2 by  FDA under an Emergency Use Authorization (EUA). This EUA will remain  in effect (meaning this test can be used) for the duration of the  Covid-19 declaration under Section 564(b)(1) of the Act, 21  U.S.C. section 360bbb-3(b)(1), unless the authorization is  terminated or revoked. Performed at [facility], [address] [place], [place] [address]     RADIOLOGY STUDIES/RESULTS: DG Chest Port 1 View  Result Date: 01/01/2020 CLINICAL DATA:  Fever EXAM: PORTABLE CHEST 1 VIEW COMPARISON:  None. FINDINGS: Left lung grossly clear. Airspace disease at the right middle lobe and right base. Normal heart size. No pneumothorax IMPRESSION: Airspace disease at the right middle lobe and right base concerning for pneumonia or aspiration. Radiographic follow-up to resolution is recommended Electronically Signed   By: Jasmine Pang M.D.   On: 01/01/2020 22:01     LOS: 1 day   Jeoffrey Massed, MD  [facility]    To contact the  attending provider between 7A-7P or the covering provider during after hours 7P-7A, please log into the web site [email or web address] and access using universal [place] password for that web site. If you do not have the password, please call the hospital operator.  01/02/2020, 11:54 AM

## 2020-01-02 NOTE — H&P (Signed)
History and Physical    Aristidis Talerico JTT:[id number] DOB: 01/02/1941 DOA: 01/01/2020  PCP: System, Pcp Not In  Patient coming from: Skilled nursing facility.  Chief Complaint: Fever and confusion.  HPI: Raymond Snyder is a 79 y.o. male with known history of dementia hyperlipidemia was brought to the ER if patient became more confused than usual with fever.  No further history is available since patient is confused and family at this time and on the bedside.  Most of the history was obtained from ER physician.  ED Course: In the ER patient was tachycardic with mildly elevated lactic acid and leukocytosis.  Chest x-ray shows infiltrates concerning for pneumonia.  Patient is agitated and confused.  Patient was started on empiric antibiotics IV fluids for sepsis secondary to pneumonia.  On my exam patient is agitated and not oriented.  Moving all extremities.  Review of Systems: As per HPI, rest all negative.   Past Medical History:  Diagnosis Date  . Dementia (HCC)     History reviewed. No pertinent surgical history.   reports that he has never smoked. He has never used smokeless tobacco. He reports that he does not drink alcohol or use drugs.  Not on File  Family History  Family history unknown: Yes    Prior to Admission medications   Not on File    Physical Exam: Constitutional: Moderately built and nourished. Vitals:   01/01/20 2245 01/01/20 2300 01/02/20 0130 01/02/20 0154  BP: (!) 170/82 (!) 165/90 (!) 143/65   Pulse: 88 85 70   Resp: (!) 23 20 15    Temp:    99.6 F (37.6 C)  TempSrc:    Axillary  SpO2: 98% 98% 96%   Weight:      Height:       Eyes: Anicteric no pallor. ENMT: No discharge from the ears eyes nose or mouth. Neck: No mass or.  No neck rigidity. Respiratory: No rhonchi or crepitations. Cardiovascular: S1-S2 heard. Abdomen: Soft nontender bowel sounds present. Musculoskeletal: No edema. Skin: No rash. Neurologic: Alert awake but confused moving  all extremities.  Does not follow commands. Psychiatric: Confused.   Labs on Admission: I have personally reviewed following labs and imaging studies  CBC: Recent Labs  Lab 01/01/20 2135  WBC 15.8*  NEUTROABS 13.7*  HGB 12.9*  HCT 39.0  MCV 97.0  PLT 157   Basic Metabolic Panel: Recent Labs  Lab 01/01/20 2135  NA 142  K 3.9  CL 101  CO2 26  GLUCOSE 109*  BUN 21  CREATININE 0.97  CALCIUM 9.2   GFR: Estimated Creatinine Clearance: 66.9 mL/min (by C-G formula based on SCr of 0.97 mg/dL). Liver Function Tests: Recent Labs  Lab 01/01/20 2135  AST 59*  ALT 33  ALKPHOS 92  BILITOT 0.9  PROT 6.7  ALBUMIN 3.6   No results for input(s): LIPASE, AMYLASE in the last 168 hours. No results for input(s): AMMONIA in the last 168 hours. Coagulation Profile: Recent Labs  Lab 01/01/20 2135  INR 1.1   Cardiac Enzymes: No results for input(s): CKTOTAL, CKMB, CKMBINDEX, TROPONINI in the last 168 hours. BNP (last 3 results) No results for input(s): PROBNP in the last 8760 hours. HbA1C: No results for input(s): HGBA1C in the last 72 hours. CBG: No results for input(s): GLUCAP in the last 168 hours. Lipid Profile: No results for input(s): CHOL, HDL, LDLCALC, TRIG, CHOLHDL, LDLDIRECT in the last 72 hours. Thyroid Function Tests: No results for input(s): TSH, T4TOTAL, FREET4, T3FREE,  THYROIDAB in the last 72 hours. Anemia Panel: No results for input(s): VITAMINB12, FOLATE, FERRITIN, TIBC, IRON, RETICCTPCT in the last 72 hours. Urine analysis:    Component Value Date/Time   COLORURINE YELLOW 01/01/2020 2121   APPEARANCEUR CLEAR 01/01/2020 2121   LABSPEC 1.016 01/01/2020 2121   PHURINE 8.0 01/01/2020 2121   GLUCOSEU NEGATIVE 01/01/2020 2121   HGBUR MODERATE (A) 01/01/2020 2121   BILIRUBINUR NEGATIVE 01/01/2020 2121   KETONESUR 5 (A) 01/01/2020 2121   PROTEINUR 100 (A) 01/01/2020 2121   NITRITE NEGATIVE 01/01/2020 2121   LEUKOCYTESUR NEGATIVE 01/01/2020 2121   Sepsis  Labs: @LABRCNTIP (procalcitonin:4,lacticidven:4) ) Recent Results (from the past 240 hour(s))  Respiratory Panel by RT PCR (Flu A&B, Covid) - Nasopharyngeal Swab     Status: None   Collection Time: 01/01/20  9:35 PM   Specimen: Nasopharyngeal Swab  Result Value Ref Range Status   SARS Coronavirus 2 by RT PCR NEGATIVE NEGATIVE Final    Comment: (NOTE) SARS-CoV-2 target nucleic acids are NOT DETECTED. The SARS-CoV-2 RNA is generally detectable in upper respiratoy specimens during the acute phase of infection. The lowest concentration of SARS-CoV-2 viral copies this assay can detect is 131 copies/mL. A negative result does not preclude SARS-Cov-2 infection and should not be used as the sole basis for treatment or other patient management decisions. A negative result may occur with  improper specimen collection/handling, submission of specimen other than nasopharyngeal swab, presence of viral mutation(s) within the areas targeted by this assay, and inadequate number of viral copies (<131 copies/mL). A negative result must be combined with clinical observations, patient history, and epidemiological information. The expected result is Negative. Fact Sheet for Patients:  01/03/20 Fact Sheet for Healthcare Providers:  https://www.moore.com/ This test is not yet ap proved or cleared by the https://www.young.biz/ FDA and  has been authorized for detection and/or diagnosis of SARS-CoV-2 by FDA under an Emergency Use Authorization (EUA). This EUA will remain  in effect (meaning this test can be used) for the duration of the COVID-19 declaration under Section 564(b)(1) of the Act, 21 U.S.C. section 360bbb-3(b)(1), unless the authorization is terminated or revoked sooner.    Influenza A by PCR NEGATIVE NEGATIVE Final   Influenza B by PCR NEGATIVE NEGATIVE Final    Comment: (NOTE) The Xpert Xpress SARS-CoV-2/FLU/RSV assay is intended as an aid in  the  diagnosis of influenza from Nasopharyngeal swab specimens and  should not be used as a sole basis for treatment. Nasal washings and  aspirates are unacceptable for Xpert Xpress SARS-CoV-2/FLU/RSV  testing. Fact Sheet for Patients: [place] Fact Sheet for Healthcare Providers: https://www.moore.com/ This test is not yet approved or cleared by the https://www.young.biz/ FDA and  has been authorized for detection and/or diagnosis of SARS-CoV-2 by  FDA under an Emergency Use Authorization (EUA). This EUA will remain  in effect (meaning this test can be used) for the duration of the  Covid-19 declaration under Section 564(b)(1) of the Act, 21  U.S.C. section 360bbb-3(b)(1), unless the authorization is  terminated or revoked. Performed at [facility], [address] [place], [place]      Radiological Exams on Admission: DG Chest Port 1 View  Result Date: 01/01/2020 CLINICAL DATA:  Fever EXAM: PORTABLE CHEST 1 VIEW COMPARISON:  None. FINDINGS: Left lung grossly clear. Airspace disease at the right middle lobe and right base. Normal heart size. No pneumothorax IMPRESSION: Airspace disease at the right middle lobe and right base concerning for pneumonia or aspiration.  Radiographic follow-up to resolution is recommended Electronically Signed   By: Donavan Foil M.D.   On: 01/01/2020 22:01    EKG: Independently reviewed.  Normal sinus rhythm with RBBB.  Assessment/Plan Principal Problem:   Sepsis ([place]) Active Problems:   CAP (community acquired pneumonia)   Acute encephalopathy    1. Sepsis secondary to pneumonia.  Patient is confused more than usual with fever and pneumonia consistent with sepsis.  Continue with fluids antibiotics follow cultures.  Will get swallow evaluation for possible aspiration.  Until then patient will be n.p.o. 2. Acute encephalopathy with history of dementia likely worsened because of pneumonia. 3. Per  chart patient is on carbamazepine and cholesterol pills which doses are not yet verified. 4. Normocytic normochromic anemia no old labs to compare.  Follow CBC.  Home medications has to be verified.  Given that patient has septic picture will need close monitoring for further deterioration in inpatient status.   DVT prophylaxis: Lovenox. Code Status: DNR. Family Communication: Will need to reach with family to get further history. Disposition Plan: To skilled nursing facility when stable. Consults called: Speech therapy and palliative care. Admission status: Inpatient.   Rise Patience MD [facility] Pager [phone].  If 7PM-7AM, please contact night-coverage [email or web address] Password [email or web address]  01/02/2020, 2:18 AM

## 2020-01-02 NOTE — Plan of Care (Signed)

## 2020-01-02 NOTE — Progress Notes (Signed)
Modified Barium Swallow Progress Note  Patient Details  Name: Raymond Snyder MRN: [id number] Date of Birth: [birth year]  Today's Date: 01/02/2020  Modified Barium Swallow completed.  Full report located under Chart Review in the Imaging Section.  Brief recommendations include the following:  Clinical Impression  Modified Barium Swallow completed after bedside swallow evaluation showed signs of dysphagia. Patient has mild-moderate oropharyngeal dysphagia characterized by delayed swallow to pyriform sinuses, reduced laryngeal anterior movement resulting in poor cricopharyngeal opening and moderate pharyngeal residue. With additional boluses pharyngeal residue continued to build and result in spill over into laryngeal vestibule. Aspiration to the cords, pt not sensate and no cough. Pt was unable to cough when cued. Pt also unable to follow commands for a chin tuck modification or multiple swallow. Patient is at a moderate to high risk for continued aspiration due to pharyngeal residue and inability to clear through multiple swallows. Recommend pt remain NPO with sips and chips for comfort. Medication crushed in small amounts of puree.  ST to follow up to determine if pt improves and is able to actively follow commands for compensatory strategies and swallow tx. May need to consider an alternative means for nutrition.    Swallow Evaluation Recommendations       SLP Diet Recommendations: NPO;Ice chips PRN after oral care;NPO except meds;Free water protocol after oral care   Liquid Administration via: Cup   Medication Administration: Crushed with puree   Supervision: Full assist for feeding   Compensations: Slow rate;Small sips/bites   Postural Changes: Seated upright at 90 degrees   Oral Care Recommendations: Oral care QID        Luis Abed., MA CCC-SLP 01/02/2020,2:17 PM

## 2020-01-02 NOTE — Evaluation (Signed)
Clinical/Bedside Swallow Evaluation Patient Details  Name: Raymond Snyder MRN: [id number] Date of Birth: 08-29-1941  Today's Date: 01/02/2020 Time: SLP Start Time (ACUTE ONLY): 1148 SLP Stop Time (ACUTE ONLY): 1212 SLP Time Calculation (min) (ACUTE ONLY): 24 min  Past Medical History:  Past Medical History:  Diagnosis Date  . Dementia [facility])    Past Surgical History: History reviewed. No pertinent surgical history. HPI:  [name], 78y/m, presented to ER due to more confusion and fever. PMH of advanced dementia and HLD. Chest x-ray shows infiltrates concerning for pneumonia. No prior knowledge of dysphagia.    Assessment / Plan / Recommendation Clinical Impression  Clinical Bedside swallow evaluation completed after pt admitted with confusion, fever and chest x-ray showing right mid/lower lobe infusions, likely Pneumonia. Patient has signs of oropharyngeal dysphagia at bedside. Patient tolerated ice chips and apple sauce with no s/s of aspiration. However, had a delayed cough with thin liquids and prolonged mastication of cracker.  Recommend MBS to evaluate swallow function further and safe diet recommendations.   SLP Visit Diagnosis: Dysphagia, oropharyngeal phase (R13.12)           Diet Recommendation NPO        Other  Recommendations  MBS     Swallow Study   General Date of Onset: 01/01/20 HPI: [name], 78y/m, presented to ER due to more confusion and fever. PMH of advanced dementia and HLD. Chest x-ray shows infiltrates concerning for pneumonia. No prior knowledge of dysphagia.  Type of Study: Bedside Swallow Evaluation Previous Swallow Assessment: none Diet Prior to this Study: NPO Temperature Spikes Noted: Yes Respiratory Status: Room air History of Recent Intubation: No Behavior/Cognition: Alert;Cooperative;Pleasant mood;Requires cueing Oral Cavity Assessment: Dry;Dried secretions Oral Care Completed by SLP: Yes Vision: Functional for  self-feeding Self-Feeding Abilities: Able to feed self;Needs assist Patient Positioning: Upright in bed Baseline Vocal Quality: Normal Volitional Cough: Cognitively unable to elicit Volitional Swallow: Unable to elicit    Oral/Motor/Sensory Function Overall Oral Motor/Sensory Function: Generalized oral weakness Facial ROM: Within Functional Limits Facial Symmetry: Within Functional Limits Facial Strength: Within Functional Limits Facial Sensation: Within Functional Limits   Ice Chips Ice chips: Within functional limits Presentation: Spoon   Thin Liquid Thin Liquid: Impaired Presentation: Cup Pharyngeal  Phase Impairments: Throat Clearing - Delayed    Nectar Thick Nectar Thick Liquid: Not tested   Honey Thick Honey Thick Liquid: Not tested   Puree Puree: Within functional limits Presentation: Spoon   Solid     Solid: Impaired Presentation: Self Fed Oral Phase Impairments: Impaired mastication Oral Phase Functional Implications: Impaired mastication Pharyngeal Phase Impairments: Suspected delayed Swallow     Luis Abed., MA, CCC-SLP 01/02/2020,1:31 PM

## 2020-01-02 NOTE — ED Notes (Signed)
GCS improved from arrival. Initial GCS 10 r/t eye opening to pain, inappropriate words, and localizing from pain. Current GCS 12: pt. Has began to communicate in small phrases. Pt still unable to follow commands.

## 2020-01-03 DIAGNOSIS — F0281 Dementia in other diseases classified elsewhere with behavioral disturbance: Secondary | ICD-10-CM

## 2020-01-03 DIAGNOSIS — Z7189 Other specified counseling: Secondary | ICD-10-CM

## 2020-01-03 DIAGNOSIS — Z515 Encounter for palliative care: Secondary | ICD-10-CM

## 2020-01-03 DIAGNOSIS — Z66 Do not resuscitate: Secondary | ICD-10-CM

## 2020-01-03 DIAGNOSIS — R410 Disorientation, unspecified: Secondary | ICD-10-CM

## 2020-01-03 DIAGNOSIS — F02818 Dementia in other diseases classified elsewhere, unspecified severity, with other behavioral disturbance: Secondary | ICD-10-CM

## 2020-01-03 LAB — BASIC METABOLIC PANEL
Anion gap: 14 (ref 5–15)
BUN: 17 mg/dL (ref 8–23)
CO2: 24 mmol/L (ref 22–32)
Calcium: 8.9 mg/dL (ref 8.9–10.3)
Chloride: 103 mmol/L (ref 98–111)
Creatinine, Ser: 0.87 mg/dL (ref 0.61–1.24)
GFR calc Af Amer: >60 mL/min (ref >60)
GFR calc non Af Amer: >60 mL/min (ref >60)
Glucose, Bld: 88 mg/dL (ref 70–99)
Potassium: 3.3 mmol/L — ABNORMAL LOW (ref 3.5–5.1)
Sodium: 141 mmol/L (ref 135–145)

## 2020-01-03 LAB — CBC
HCT: 36.4 % — ABNORMAL LOW (ref 39.0–52.0)
Hemoglobin: 12.2 g/dL — ABNORMAL LOW (ref 13.0–17.0)
MCH: 32.4 pg (ref 26.0–34.0)
MCHC: 33.5 g/dL (ref 30.0–36.0)
MCV: 96.8 fL (ref 80.0–100.0)
Platelets: 139 10*3/uL — ABNORMAL LOW (ref 150–400)
RBC: 3.76 MIL/uL — ABNORMAL LOW (ref 4.22–5.81)
RDW: 12.5 % (ref 11.5–15.5)
WBC: 12.1 10*3/uL — ABNORMAL HIGH (ref 4.0–10.5)
nRBC: 0 % (ref 0.0–0.2)

## 2020-01-03 LAB — GLUCOSE, CAPILLARY
Glucose-Capillary: 102 mg/dL — ABNORMAL HIGH (ref 70–99)
Glucose-Capillary: 114 mg/dL — ABNORMAL HIGH (ref 70–99)
Glucose-Capillary: 96 mg/dL (ref 70–99)
Glucose-Capillary: 97 mg/dL (ref 70–99)
Glucose-Capillary: 99 mg/dL (ref 70–99)

## 2020-01-03 LAB — URINE CULTURE: Culture: NO GROWTH

## 2020-01-03 LAB — MAGNESIUM: Magnesium: 1.9 mg/dL (ref 1.7–2.4)

## 2020-01-03 MED ORDER — POTASSIUM CHLORIDE CRYS ER 20 MEQ PO TBCR
20.0000 meq | EXTENDED_RELEASE_TABLET | Freq: Once | ORAL | Status: AC
  Administered 2020-01-03: 20 meq via ORAL
  Filled 2020-01-03: qty 1

## 2020-01-03 MED ORDER — MAGNESIUM SULFATE 2 GM/50ML IV SOLN
2.0000 g | Freq: Once | INTRAVENOUS | Status: AC
  Administered 2020-01-03: 2 g via INTRAVENOUS
  Filled 2020-01-03: qty 50

## 2020-01-03 MED ORDER — SENNOSIDES-DOCUSATE SODIUM 8.6-50 MG PO TABS
1.0000 | ORAL_TABLET | Freq: Two times a day (BID) | ORAL | Status: DC
  Administered 2020-01-03 – 2020-01-04 (×3): 1 via ORAL
  Filled 2020-01-03 (×3): qty 1

## 2020-01-03 MED ORDER — POTASSIUM CHLORIDE CRYS ER 20 MEQ PO TBCR
40.0000 meq | EXTENDED_RELEASE_TABLET | Freq: Once | ORAL | Status: AC
  Administered 2020-01-03: 40 meq via ORAL
  Filled 2020-01-03: qty 2

## 2020-01-03 NOTE — Consult Note (Signed)
Consultation Note Date: 01/03/2020   Patient Name: Raymond Snyder  DOB: 1940/12/09  MRN: [id number]  Age / Sex: 79 y.o., male  PCP: System, Pcp Not In Referring Physician: Jonetta Osgood, MD  Reason for Consultation: Establishing goals of care  HPI/Patient Profile:  Raymond Snyder is a 79 y.o. male with known history of dementia hyperlipidemia was brought to the ER if patient became more confused than usual with fever.  No further history is available since patient is confused and family at this time and on the bedside.  Most of the history was obtained from ER physician.  Palliative care was asked to get involved to help with goals of care conversations. Patient has suffered from some dysphagia and recurrence of aspiration pneumonia. He has more advanced dementia and is not a good candidate for long term tube feeding options.   Clinical Assessment and Goals of Care: I have reviewed medical records including EPIC notes, labs and imaging, received report from bedside RN, assessed the patient.    I met with Raymond Snyder (daughter) to further discuss diagnosis prognosis, GOC, EOL wishes, disposition and options.   I introduced Palliative Medicine as specialized medical care for people living with serious illness. It focuses on providing relief from the symptoms and stress of a serious illness. The goal is to improve quality of life for both the patient and the family.  Raymond Snyder expressed that she was taken aback that Palliative care was involved in her fathers case. I shared that we often talk to patients and families about the goals for patients moving forward. I discussed how aspiration pneumonia can often be recurrent and the reality that rehospitalization and possibly poor outcomes can occur.   A detailed discussion was had today regarding concepts specific to code status, artifical feeding and hydration,  continued IV antibiotics and rehospitalization was had.  Raymond Snyder shared that Raymond Snyder is DNR/DNI. She said that she will arrive later this afternoon so that we may speak in person.   Discussed the importance of continued conversation with family and their  medical providers regarding overall plan of care and treatment options, ensuring decisions are within the context of the patients values and GOCs.  Decision Maker: Raymond Snyder (Daughter) - [phone]  SUMMARY OF RECOMMENDATIONS   DNAR/DNI  Will try to get MOST form completed  Chaplain Support  Symptom management as below  Code Status/Advance Care Planning:  DNR  Symptom Management:  Sepsis d/t Pneumonia:  - Azithromycin/CTX   - PRN O2   Dysphagia:  Per SLP Note    *Diet recommendations: Regular;Nectar-thick liquid(free water between meals)   *Medication Administration: Whole meds with puree   *Supervision: Patient able to self feed;Full supervision/cueing for compensatory strategies   *Compensations: Slow rate;Small sips/bites(pinch off straw if needed to control bolus amounts)   *Postural Changes and/or Swallow Maneuvers: Seated upright 90 degrees;Upright 30-60 min after meal Dementia:  - Namenda 71m PO BID  Delirium:  - 1:1 sitter                 -  Delirium precautions                 - Get up during the day                 - Encourage a familiar face to remain present throughout the day                 - Keep blinds open and lights on during daylight hours                 - Minimize the use of opioids/benzodiazepines      *Agitated delirium started on     Haldol '5mg'$  BID     Haldol 7.'5mg'$  PO QHS    Haldol '1mg'$  IV Q6H PRN    Olanzapine 2.'5mg'$  PO BID PRN   Insomnia:  - Trazodone '100mg'$  PO QHS  - Clonazepam initiated while hospitalization 0.'25mg'$  PO QHS  Palliative Prophylaxis:   Aspiration, Bowel Regimen, Delirium Protocol, Eye Care, Frequent Pain Assessment, Oral Care, Palliative Wound Care and Turn  Reposition  Additional Recommendations (Limitations, Scope, Preferences):  Full Scope Treatment  Psycho-social/Spiritual:   Desire for further Chaplaincy support: No  Additional Recommendations: Caregiving  Support/Resources  Prognosis:   Unable to determine  Discharge Planning: To Be Determined     Primary Diagnoses: Present on Admission: . CAP (community acquired pneumonia) . Acute encephalopathy . Sepsis ([place])  I have reviewed the medical record, interviewed the patient and family, and examined the patient. The following aspects are pertinent.  Past Medical History:  Diagnosis Date  . Dementia [facility])    Social History   Socioeconomic History  . Marital status: Unknown    Spouse name: Not on file  . Number of children: Not on file  . Years of education: Not on file  . Highest education level: Not on file  Occupational History  . Not on file  Tobacco Use  . Smoking status: Never Smoker  . Smokeless tobacco: Never Used  Substance and Sexual Activity  . Alcohol use: Never  . Drug use: Never  . Sexual activity: Not on file  Other Topics Concern  . Not on file  Social History Narrative  . Not on file   Social Determinants of Health   Financial Resource Strain:   . Difficulty of Paying Living Expenses:   Food Insecurity:   . Worried About Charity fundraiser in the Last Year:   . Arboriculturist in the Last Year:   Transportation Needs:   . Film/video editor (Medical):   Marland Kitchen Lack of Transportation (Non-Medical):   Physical Activity:   . Days of Exercise per Week:   . Minutes of Exercise per Session:   Stress:   . Feeling of Stress :   Social Connections:   . Frequency of Communication with Friends and Family:   . Frequency of Social Gatherings with Friends and Family:   . Attends Religious Services:   . Active Member of Clubs or Organizations:   . Attends Archivist Meetings:   Marland Kitchen Marital Status:    Family History  Family history  unknown: Yes   Scheduled Meds: . aspirin EC  81 mg Oral Daily  . atorvastatin  80 mg Oral QHS  . carbamazepine  200 mg Oral TID  . chlorhexidine  15 mL Mouth Rinse BID  . clonazepam  0.25 mg Oral QHS  . enoxaparin (LOVENOX) injection  40 mg Subcutaneous Q24H  . haloperidol  5  mg Oral BID   And  . haloperidol  7.5 mg Oral QHS  . mouth rinse  15 mL Mouth Rinse q12n4p  . memantine  10 mg Oral BID  . traZODone  100 mg Oral QHS   Continuous Infusions: . azithromycin 500 mg (01/02/20 2257)  . cefTRIAXone (ROCEPHIN)  IV Stopped (01/02/20 2140)   PRN Meds:.acetaminophen **OR** acetaminophen, haloperidol lactate, hydrALAZINE, OLANZapine, ondansetron **OR** ondansetron (ZOFRAN) IV Medications Prior to Admission:  Prior to Admission medications   Medication Sig Start Date End Date Taking? Authorizing Provider  aspirin EC 81 MG tablet Take 81 mg by mouth daily.   Yes [provider]  atorvastatin (LIPITOR) 80 MG tablet Take 80 mg by mouth at bedtime.  11/26/19  Yes [provider]  carbamazepine (TEGRETOL) 200 MG tablet Take 200 mg by mouth in the morning, at noon, and at bedtime. 11/26/19  Yes [provider]  clonazePAM (KLONOPIN) 0.5 MG tablet Take 0.25 mg by mouth in the morning and at bedtime. 12/31/19  Yes [provider]  docusate (COLACE) 50 MG/5ML liquid Take 100 mg by mouth daily as needed for mild constipation.   Yes [provider]  haloperidol (HALDOL) 5 MG tablet Take 5-7.5 mg by mouth See admin instructions. Take 47m twice daily and 7.560mat bedtime. 12/02/19  Yes [provider]  memantine (NAMENDA) 10 MG tablet Take 10 mg by mouth 2 (two) times daily.   Yes [provider]  Multiple Vitamin (MULTIVITAMIN WITH MINERALS) TABS tablet Take 1 tablet by mouth daily.   Yes [provider]  OLANZapine (ZYPREXA) 5 MG tablet Take 2.5 mg by mouth every 12 (twelve) hours as needed (agitation/aggression).   Yes [provider]  traZODone (DESYREL) 100 MG tablet Take 100 mg by mouth at bedtime.   Yes [provider]   Not on File Review of Systems  Unable to perform ROS: Dementia   Physical Exam Vitals and nursing note reviewed.  HENT:     Head: Normocephalic.     Nose: Nose normal.     Mouth/Throat:     Mouth: Mucous membranes are dry.  Eyes:     Pupils: Pupils are equal, round, and reactive to light.  Cardiovascular:     Rate and Rhythm: Regular rhythm.     Pulses: Normal pulses.  Pulmonary:     Effort: Pulmonary effort is normal.  Abdominal:     General: Abdomen is flat.     Palpations: Abdomen is soft.  Musculoskeletal:        General: Normal range of motion.     Cervical back: Normal range of motion.  Skin:    General: Skin is warm and dry.     Capillary Refill: Capillary refill takes less than 2 seconds.  Neurological:     Mental Status: He is alert. He is disoriented.    Vital Signs: BP (!) 147/77   Pulse 79   Temp 98 F (36.7 C)   Resp 20   Ht _0  (1.778 m)   Wt 79.8 kg   SpO2 98%   BMI 25.25 kg/m  Pain Scale: PAINAD   Pain Score: Asleep  SpO2: SpO2: 98 % O2 Device:SpO2: 98 % O2 Flow Rate: .   IO: Intake/output summary:   Intake/Output Summary (Last 24 hours) at 01/03/2020 0703 Last data filed at 01/02/2020 1500 Gross per 24 hour  Intake --  Output 75 ml  Net -75 ml   LBM: Last BM Date: (  PTA) Baseline Weight: Weight: 79.8 kg Most recent weight: Weight: 79.8 kg     Palliative Assessment/Data: 30%  Time In: 0800 Time Out: 0910 Time Total: 70 Greater than 50%  of this time was spent counseling and coordinating care related to the above assessment and plan.  Signed by: Rosezella Rumpf, NP   Please contact Palliative Medicine Team phone at [phone] for questions and concerns.  For individual provider: See Shea Evans

## 2020-01-03 NOTE — Progress Notes (Signed)
PROGRESS NOTE        PATIENT DETAILS Name: Raymond Snyder Age: 79 y.o. Sex: male Date of Birth: [birth year] Admit Date: 01/01/2020 Admitting Physician Rise Patience, MD CHY:IFOYDX, Pcp Not In  Brief Narrative: Patient is a 79 y.o. male with past medical history of dementia, HLD-brought from his memory care unit for fever and confusion-found to have aspiration pneumonitis.  Significant events: 3/18>> admit for aspiration pneumonia  Antimicrobial therapy: Rocephin: 3/17>> Zithromax: 3/17>>  Microbiology data: 3/17>> blood cultures: Negative 3/17>> urine culture: No growth 3/17>> Covid/influenza negative.  Procedures : None  Consults: None  DVT Prophylaxis : Prophylactic Lovenox   Subjective: Much better-sitter at bedside-patient is very pleasantly confused.  Assessment/Plan: Sepsis secondary to aspiration pneumonia (POA): Sepsis physiology has resolved-blood cultures negative-continue IV antibiotics for 1 more day-if clinical improvement continues-suspect could be transition to oral antimicrobial therapy tomorrow.    Dysphagia: Probably secondary to dementia-appreciate SLP eval-started on regular diet  Dementia with superimposed acute metabolic encephalopathy secondary to sepsis/aspiration pneumonia: Much better-he can actually follow some commands-no restraints in place-pleasantly confused-have resumed his usual dementia/antipsychotic regimen.   Hypokalemia: Replete and recheck  Diet: Diet Order            Diet regular Room service appropriate? No; Fluid consistency: Nectar Thick  Diet effective now               Code Status: DNR-reconfirmed with daughter on 3/18  Family Communication: Left Voicemail for daughter on 3/19  Disposition Plan: ALF when ready for discharge-hopefully on 3/20  Barriers to Discharge:  Sepsis with Asp PNA-requiring IV Antibiotics  Antimicrobial agents: Anti-infectives (From admission, onward)   Start     Dose/Rate Route Frequency Ordered Stop   01/02/20 2200  cefTRIAXone (ROCEPHIN) 2 g in sodium chloride 0.9 % 100 mL IVPB     2 g 200 mL/hr over 30 Minutes Intravenous Daily at bedtime 01/02/20 0216 01/07/20 2159   01/02/20 2200  azithromycin (ZITHROMAX) 500 mg in sodium chloride 0.9 % 250 mL IVPB     500 mg 250 mL/hr over 60 Minutes Intravenous Daily at bedtime 01/02/20 0216 01/07/20 2159   01/01/20 2215  azithromycin (ZITHROMAX) 500 mg in sodium chloride 0.9 % 250 mL IVPB     500 mg 250 mL/hr over 60 Minutes Intravenous  Once 01/01/20 2207 01/02/20 0100   01/01/20 2200  cefTRIAXone (ROCEPHIN) 1 g in sodium chloride 0.9 % 100 mL IVPB  Status:  Discontinued     1 g 200 mL/hr over 30 Minutes Intravenous Daily at bedtime 01/01/20 2121 01/02/20 0216       Time spent: 25-minutes-Greater than 50% of this time was spent in counseling, explanation of diagnosis, planning of further management, and coordination of care.  MEDICATIONS: Scheduled Meds: . aspirin EC  81 mg Oral Daily  . atorvastatin  80 mg Oral QHS  . carbamazepine  200 mg Oral TID  . chlorhexidine  15 mL Mouth Rinse BID  . clonazepam  0.25 mg Oral QHS  . enoxaparin (LOVENOX) injection  40 mg Subcutaneous Q24H  . haloperidol  5 mg Oral BID   And  . haloperidol  7.5 mg Oral QHS  . mouth rinse  15 mL Mouth Rinse q12n4p  . memantine  10 mg Oral BID  . senna-docusate  1 tablet Oral BID  . traZODone  100 mg  Oral QHS   Continuous Infusions: . azithromycin 500 mg (01/02/20 2257)  . cefTRIAXone (ROCEPHIN)  IV Stopped (01/02/20 2140)   PRN Meds:.acetaminophen **OR** acetaminophen, haloperidol lactate, hydrALAZINE, OLANZapine, ondansetron **OR** ondansetron (ZOFRAN) IV   PHYSICAL EXAM: Vital signs: Vitals:   01/02/20 2146 01/02/20 2238 01/03/20 0000 01/03/20 0358  BP: (!) 175/89   (!) 147/77  Pulse: 67   79  Resp: 20     Temp: (!) 100.4 F (38 C) 100.3 F (37.9 C) 98.2 F (36.8 C) 98 F (36.7 C)  TempSrc: Oral  Oral Axillary   SpO2: 94%   98%  Weight:      Height:       Filed Weights   01/01/20 2138  Weight: 79.8 kg   Body mass index is 25.25 kg/m.   Gen Exam:Pleasantly confued-not in any distress HEENT:atraumatic, normocephalic Chest: B/L clear to auscultation anteriorly CVS:S1S2 regular Abdomen:soft non tender, non distended Extremities:no edema Neurology: Non focal Skin: no rash  I have personally reviewed following labs and imaging studies  LABORATORY DATA: CBC: Recent Labs  Lab 01/01/20 2135 01/02/20 0535 01/03/20 0409  WBC 15.8* 14.4* 12.1*  NEUTROABS 13.7* 12.8*  --   HGB 12.9* 11.6* 12.2*  HCT 39.0 34.1* 36.4*  MCV 97.0 95.5 96.8  PLT 157 131* 139*    Basic Metabolic Panel: Recent Labs  Lab 01/01/20 2135 01/02/20 0535 01/03/20 0409  NA 142 140 141  K 3.9 3.4* 3.3*  CL 101 101 103  CO2 26 27 24   GLUCOSE 109* 107* 88  BUN 21 16 17   CREATININE 0.97 0.84 0.87  CALCIUM 9.2 8.6* 8.9  MG  --   --  1.9    GFR: Estimated Creatinine Clearance: 74.6 mL/min (by C-G formula based on SCr of 0.87 mg/dL).  Liver Function Tests: Recent Labs  Lab 01/01/20 2135 01/02/20 0535  AST 59* 54*  ALT 33 29  ALKPHOS 92 85  BILITOT 0.9 0.7  PROT 6.7 5.5*  ALBUMIN 3.6 2.8*   No results for input(s): LIPASE, AMYLASE in the last 168 hours. No results for input(s): AMMONIA in the last 168 hours.  Coagulation Profile: Recent Labs  Lab 01/01/20 2135  INR 1.1    Cardiac Enzymes: No results for input(s): CKTOTAL, CKMB, CKMBINDEX, TROPONINI in the last 168 hours.  BNP (last 3 results) No results for input(s): PROBNP in the last 8760 hours.  Lipid Profile: No results for input(s): CHOL, HDL, LDLCALC, TRIG, CHOLHDL, LDLDIRECT in the last 72 hours.  Thyroid Function Tests: No results for input(s): TSH, T4TOTAL, FREET4, T3FREE, THYROIDAB in the last 72 hours.  Anemia Panel: No results for input(s): VITAMINB12, FOLATE, FERRITIN, TIBC, IRON, RETICCTPCT in the last 72  hours.  Urine analysis:    Component Value Date/Time   COLORURINE YELLOW 01/01/2020 2121   APPEARANCEUR CLEAR 01/01/2020 2121   LABSPEC 1.016 01/01/2020 2121   PHURINE 8.0 01/01/2020 2121   GLUCOSEU NEGATIVE 01/01/2020 2121   HGBUR MODERATE (A) 01/01/2020 2121   BILIRUBINUR NEGATIVE 01/01/2020 2121   KETONESUR 5 (A) 01/01/2020 2121   PROTEINUR 100 (A) 01/01/2020 2121   NITRITE NEGATIVE 01/01/2020 2121   LEUKOCYTESUR NEGATIVE 01/01/2020 2121    Sepsis Labs: Lactic Acid, Venous    Component Value Date/Time   LATICACIDVEN 1.0 01/02/2020 0535    MICROBIOLOGY: Recent Results (from the past 240 hour(s))  Blood Culture (routine x 2)     Status: None (Preliminary result)   Collection Time: 01/01/20  9:35 PM   Specimen:  BLOOD LEFT FOREARM  Result Value Ref Range Status   Specimen Description BLOOD LEFT FOREARM  Final   Special Requests   Final    BOTTLES DRAWN AEROBIC AND ANAEROBIC Blood Culture adequate volume   Culture   Final    NO GROWTH 2 DAYS Performed at [facility], [address], [place], [place] [address]    Report Status PENDING  Incomplete  Blood Culture (routine x 2)     Status: None (Preliminary result)   Collection Time: 01/01/20  9:35 PM   Specimen: BLOOD  Result Value Ref Range Status   Specimen Description BLOOD LEFT ANTECUBITAL  Final   Special Requests   Final    BOTTLES DRAWN AEROBIC AND ANAEROBIC Blood Culture adequate volume   Culture   Final    NO GROWTH 2 DAYS Performed at [facility], [address] [place], [place] [address]    Report Status PENDING  Incomplete  Respiratory Panel by RT PCR (Flu A&B, Covid) - Nasopharyngeal Swab     Status: None   Collection Time: 01/01/20  9:35 PM   Specimen: Nasopharyngeal Swab  Result Value Ref Range Status   SARS Coronavirus 2 by RT PCR NEGATIVE NEGATIVE Final    Comment: (NOTE) SARS-CoV-2 target nucleic acids are NOT DETECTED. The SARS-CoV-2 RNA is generally detectable in upper respiratoy  specimens during the acute phase of infection. The lowest concentration of SARS-CoV-2 viral copies this assay can detect is 131 copies/mL. A negative result does not preclude SARS-Cov-2 infection and should not be used as the sole basis for treatment or other patient management decisions. A negative result may occur with  improper specimen collection/handling, submission of specimen other than nasopharyngeal swab, presence of viral mutation(s) within the areas targeted by this assay, and inadequate number of viral copies (<131 copies/mL). A negative result must be combined with clinical observations, patient history, and epidemiological information. The expected result is Negative. Fact Sheet for Patients:  https://www.moore.com/ Fact Sheet for Healthcare Providers:  https://www.young.biz/ This test is not yet ap proved or cleared by the [place] FDA and  has been authorized for detection and/or diagnosis of SARS-CoV-2 by FDA under an Emergency Use Authorization (EUA). This EUA will remain  in effect (meaning this test can be used) for the duration of the COVID-19 declaration under Section 564(b)(1) of the Act, 21 U.S.C. section 360bbb-3(b)(1), unless the authorization is terminated or revoked sooner.    Influenza A by PCR NEGATIVE NEGATIVE Final   Influenza B by PCR NEGATIVE NEGATIVE Final    Comment: (NOTE) The Xpert Xpress SARS-CoV-2/FLU/RSV assay is intended as an aid in  the diagnosis of influenza from Nasopharyngeal swab specimens and  should not be used as a sole basis for treatment. Nasal washings and  aspirates are unacceptable for Xpert Xpress SARS-CoV-2/FLU/RSV  testing. Fact Sheet for Patients: https://www.moore.com/ Fact Sheet for Healthcare Providers: https://www.young.biz/ This test is not yet approved or cleared by the [place] FDA and  has been authorized for detection and/or  diagnosis of SARS-CoV-2 by  FDA under an Emergency Use Authorization (EUA). This EUA will remain  in effect (meaning this test can be used) for the duration of the  Covid-19 declaration under Section 564(b)(1) of the Act, 21  U.S.C. section 360bbb-3(b)(1), unless the authorization is  terminated or revoked. Performed at [facility], [address] [place], [place] [address]   Urine culture     Status: None   Collection Time:  01/01/20 10:11 PM   Specimen: In/Out Cath Urine  Result Value Ref Range Status   Specimen Description IN/OUT CATH URINE  Final   Special Requests NONE  Final   Culture   Final    NO GROWTH Performed at [facility], [address] [place], [place] [address]    Report Status 01/03/2020 FINAL  Final    RADIOLOGY STUDIES/RESULTS: DG Chest Port 1 View  Result Date: 01/01/2020 CLINICAL DATA:  Fever EXAM: PORTABLE CHEST 1 VIEW COMPARISON:  None. FINDINGS: Left lung grossly clear. Airspace disease at the right middle lobe and right base. Normal heart size. No pneumothorax IMPRESSION: Airspace disease at the right middle lobe and right base concerning for pneumonia or aspiration. Radiographic follow-up to resolution is recommended Electronically Signed   By: Jasmine Pang M.D.   On: 01/01/2020 22:01   DG Swallowing Func-Speech Pathology  Result Date: 01/02/2020 Objective Swallowing Evaluation: Type of Study: Bedside Swallow Evaluation  Patient Details Name: Dekari Bures MRN: [id number] Date of Birth: 30-Mar-1941 Today's Date: 01/02/2020 Time: SLP Start Time (ACUTE ONLY): 1148 -SLP Stop Time (ACUTE ONLY): 1212 SLP Time Calculation (min) (ACUTE ONLY): 24 min Past Medical History: Past Medical History: Diagnosis Date . Dementia [facility])  Past Surgical History: No past surgical history on file. HPI: Mr Oshua Mcconaha, 78y/m, presented to ER due to more confusion and fever. PMH of advanced dementia and HLD. Chest x-ray shows infiltrates concerning for pneumonia. No prior  knowledge of dysphagia.   Subjective: confused Assessment / Plan / Recommendation CHL IP CLINICAL IMPRESSIONS 01/02/2020 Clinical Impression Modified Barium Swallow completed after bedside swallow evaluation showed signs of dysphagia. Patient has mild-moderate oropharyngeal dysphagia characterized by delayed swallow to pyriform sinuses, reduced laryngeal anterior movement resulting in poor cricopharyngeal opening and moderate pharyngeal residue. With additional boluses pharyngeal residue continued to build and result in spill over into laryngeal vestibule. Aspiration to the cords, pt not sensate and no cough. Pt was unable to cough when cued. Pt also unable to follow commands for a chin tuck modification or multiple swallow. Patient is at a moderate to high risk for continued aspiration due to pharyngeal residue and inability to clear through multiple swallows. Recommend pt remain NPO with sips and chips for comfort. Medication crushed in small amounts of puree.  ST to follow up to determine if pt improves and is able to actively follow commands for compensatory strategies and swallow tx. May need to consider an alternative means for nutrition.  SLP Visit Diagnosis Dysphagia, oropharyngeal phase (R13.12) Attention and concentration deficit following -- Frontal lobe and executive function deficit following -- Impact on safety and function --   CHL IP TREATMENT RECOMMENDATION 01/02/2020 Treatment Recommendations Therapy as outlined in treatment plan below   Prognosis 01/02/2020 Prognosis for Safe Diet Advancement Guarded Barriers to Reach Goals Cognitive deficits Barriers/Prognosis Comment -- CHL IP DIET RECOMMENDATION 01/02/2020 SLP Diet Recommendations NPO;Ice chips PRN after oral care;NPO except meds;Free water protocol after oral care Liquid Administration via Cup Medication Administration Crushed with puree Compensations Slow rate;Small sips/bites Postural Changes Seated upright at 90 degrees   CHL IP OTHER  RECOMMENDATIONS 01/02/2020 Recommended Consults -- Oral Care Recommendations Oral care QID Other Recommendations --   CHL IP FOLLOW UP RECOMMENDATIONS 01/02/2020 Follow up Recommendations Skilled Nursing facility   [facility] IP FREQUENCY AND DURATION 01/02/2020 Speech Therapy Frequency (ACUTE ONLY) min 2x/week Treatment Duration 2 weeks      CHL IP ORAL PHASE 01/02/2020 Oral Phase Impaired Oral - Pudding Teaspoon --  Oral - Pudding Cup -- Oral - Honey Teaspoon -- Oral - Honey Cup -- Oral - Nectar Teaspoon -- Oral - Nectar Cup -- Oral - Nectar Straw -- Oral - Thin Teaspoon -- Oral - Thin Cup -- Oral - Thin Straw -- Oral - Puree -- Oral - Mech Soft Lingual/palatal residue;Delayed oral transit Oral - Regular -- Oral - Multi-Consistency -- Oral - Pill -- Oral Phase - Comment --  CHL IP PHARYNGEAL PHASE 01/02/2020 Pharyngeal Phase Impaired Pharyngeal- Pudding Teaspoon -- Pharyngeal -- Pharyngeal- Pudding Cup -- Pharyngeal -- Pharyngeal- Honey Teaspoon -- Pharyngeal -- Pharyngeal- Honey Cup -- Pharyngeal -- Pharyngeal- Nectar Teaspoon -- Pharyngeal -- Pharyngeal- Nectar Cup -- Pharyngeal -- Pharyngeal- Nectar Straw -- Pharyngeal -- Pharyngeal- Thin Teaspoon -- Pharyngeal -- Pharyngeal- Thin Cup Delayed swallow initiation-pyriform sinuses;Reduced anterior laryngeal mobility;Reduced laryngeal elevation;Pharyngeal residue - pyriform;Pharyngeal residue - posterior pharnyx;Penetration/Apiration after swallow;Moderate aspiration Pharyngeal Material enters airway, CONTACTS cords and not ejected out;Material enters airway, passes BELOW cords without attempt by patient to eject out (silent aspiration) Pharyngeal- Thin Straw -- Pharyngeal -- Pharyngeal- Puree Delayed swallow initiation-vallecula;Reduced anterior laryngeal mobility;Pharyngeal residue - pyriform;Pharyngeal residue - posterior pharnyx Pharyngeal -- Pharyngeal- Mechanical Soft Delayed swallow initiation-vallecula;Reduced anterior laryngeal mobility;Pharyngeal residue -  pyriform;Pharyngeal residue - posterior pharnyx Pharyngeal -- Pharyngeal- Regular -- Pharyngeal -- Pharyngeal- Multi-consistency -- Pharyngeal -- Pharyngeal- Pill -- Pharyngeal -- Pharyngeal Comment --  CHL IP CERVICAL ESOPHAGEAL PHASE 01/02/2020 Cervical Esophageal Phase Impaired Pudding Teaspoon -- Pudding Cup -- Honey Teaspoon -- Honey Cup -- Nectar Teaspoon -- Nectar Cup -- Nectar Straw -- Thin Teaspoon Reduced cricopharyngeal relaxation;Prominent cricopharyngeal segment Thin Cup Reduced cricopharyngeal relaxation;Prominent cricopharyngeal segment Thin Straw -- Puree -- Mechanical Soft -- Regular -- Multi-consistency -- Pill -- Cervical Esophageal Comment -- Raymond AbedSarah W., MA CCC-SLP 01/02/2020, 2:16 PM                LOS: 2 days   Jeoffrey MassedShanker Bj Morlock, MD  [facility]    To contact the attending provider between 7A-7P or the covering provider during after hours 7P-7A, please log into the web site [email or web address] and access using universal [place] password for that web site. If you do not have the password, please call the hospital operator.  01/03/2020, 2:38 PM

## 2020-01-03 NOTE — Progress Notes (Signed)
   Palliative Medicine Inpatient Follow Up Note   In early evening around 1715 I was able to meet in person with Hrithik's daughter, Lorina Rabon. We talked at length about the progression of Alzheimer's type dementia. We discussed the ongoing risks for aspiration in the setting of its progression. She shared with me that Micheal's decline had to her been rapid from 2018 to present. She has struggled with initially living with him to then finding him alternative placement due to his care needs. He has been hospitalized at the [facility] psychiatry ward twice due to agitated dementia. She reviews how difficult things have been over the last three years. She said that she is not a part of a support group though would be interested in this.  We discussed the MOST form in detail. Keri had filled one out prior though it had been lost therefore we completed another one. Martise is a DNR, would want limited interventions inclusive of time trial of treatments with antibiotics and IV fluids. He would have not wanted a G-Tube.   We reviewed that in Shai's case we would not recommend a g-tube. Health care professionals commonly rely on feeding tubes to supply nutrition to these severely demented patients. However, various studies have not shown use of feeding tubes to be effective in preventing malnutrition. Furthermore, they have not been demonstrated to prevent the occurrence or increase the healing of pressure sores, prevent aspiration pneumonia, provide comfort, improve functional status, or extend life. High complication rates, increased use of restraints, and other adverse effects further increase the burden of feeding tubes in severely demented patients.  We concluded by a summary of the topics we had discussed. Jarett may discharge tomorrow. I shared with Lorina Rabon that I will leave resources regarding alzheimer's support groups at bedside.   Time In: 1715 Time Out: 1610 Time Spent:  55 Greater than 50% of the  time was spent in counseling and coordination of care ______________________________________________________________________________________ Lamarr Lulas [facility] Palliative Medicine Team Team Cell Phone: [phone] Please utilize secure chat with additional questions, if there is no response within 30 minutes please call the above phone number  Palliative Medicine Team providers are available by phone from 7am to 7pm daily and can be reached through the team cell phone.  Should this patient require assistance outside of these hours, please call the patient's attending physician.

## 2020-01-03 NOTE — Progress Notes (Addendum)
  Speech Language Pathology Treatment: Dysphagia  Patient Details Name: Raymond Snyder MRN: [id number] DOB: 10-May-1941 Today's Date: 01/03/2020 Time: 0802-0825 SLP Time Calculation (min) (ACUTE ONLY): 23 min  Assessment / Plan / Recommendation Clinical Impression  Total cues requiring SLP to seal off straw to prevent pt from consuming large amounts of liquids, pt without evidence of gross pharyngeal retention today - and consumed 4 graham crackers, 4 ounces puree and 4 ounces juice (some thin some nectar).  If pt did not clear boluses into pharynx - would expect overt aspiration producing cough with large amount of intake.  Aspiration is amount dependent and if pt aspirates a large amount, he will produce a cough.  Only cough observed with thin consumption - Although pt SILENTLY aspirated on MBS yesterday.    Given his advanced age, dementia PEG tube would not change his outcomes and clearly he derives pleasure from eating.  Swallow ability will wax/wane with pt's medical issues/dementia.    SLP advises he consume regular/nectar diet and allow thin water between meals after mouth care.  Relayed recommendations to RN, sitter and NP for palliative - provided precaution signs to RN also.  Use straw with liquids to allow bolus flow to be controlled for maximal airway protection.      HPI HPI: 79 yo male adm to ed with fever and confusion.  PMH + for advanced dementia, HLD. CXR concerning for pna/aspiration.  MBS done 3/18 showing retention and aspiration. Follow up indicated today to assess readiness for po/improvement.      SLP Plan  Continue with current plan of care       Recommendations  Diet recommendations: Regular;Nectar-thick liquid(free water between meals) Medication Administration: Whole meds with puree Supervision: Patient able to self feed;Full supervision/cueing for compensatory strategies Compensations: Slow rate;Small sips/bites(pinch off straw if needed to control bolus  amounts) Postural Changes and/or Swallow Maneuvers: Seated upright 90 degrees;Upright 30-60 min after meal                Oral Care Recommendations: Oral care BID;Oral care prior to ice chip/H20 Follow up Recommendations: Skilled Nursing facility SLP Visit Diagnosis: Dysphagia, oropharyngeal phase (R13.12) Plan: Continue with current plan of care       GO                Raymond Snyder 01/03/2020, 10:01 AM  Raymond Infante, MS [facility] SLP Acute Rehab Services Office [phone]

## 2020-01-03 NOTE — Progress Notes (Signed)
Occupational Therapy Evaluation and Discharge Patient Details Name: Raymond Snyder MRN: [id number] DOB: 1940-12-22 Today's Date: 01/03/2020    History of Present Illness 79 y.o. male presents to ED from [facility] with fever and AMS. Known history of dementia and hyperlipidemia. Chest x-ray shows infiltrates concerning for pneumonia. Admitted 01/01/20 for treatment of CAP and acute encephalopathy   Clinical Impression   PTA, pt resides at [facility] memory care unit. Pt received in recliner chair, attempting to get up without assistance. Pt overall min guard/supervision for sit to stand transfers without AD, Min A for safe stand pivots and mobility to/from bathroom without AD. Attempted to use RW for stability, but pt with difficulty following tactile cues. Pt removed gait belt during session. Pt also refused to sit safely, increasing impulsivity and agitation. Pt required manually assist to sit down in recliner chair, then bed... Pt talking throughout session, but did only 10% of responses were appropriate to current task at hand. Required consistent cues for safety throughout, but did not follow them despite multi modal cueing. Pt not safe to participate in consistent therapy at this time. No OT follow up.    Follow Up Recommendations  No OT follow up    Equipment Recommendations       Recommendations for Other Services       Precautions / Restrictions Precautions Precautions: Fall Precaution Comments: sitter present Restrictions Weight Bearing Restrictions: No      Mobility Bed Mobility               General bed mobility comments: pt up in recliner   Transfers Overall transfer level: Needs assistance Equipment used: None;Rolling walker (2 wheeled) Transfers: Sit to/from [employer] Transfers Sit to Stand: Supervision;Min guard Stand pivot transfers: Min assist;Min guard       General transfer comment: Requires Max A to sit, requiring therapist  manually to sit pt down    Balance Overall balance assessment: Needs assistance Sitting-balance support: Feet supported;No upper extremity supported Sitting balance-Leahy Scale: Fair     Standing balance support: No upper extremity supported;During functional activity Standing balance-Leahy Scale: Poor                             ADL either performed or assessed with clinical judgement   ADL Overall ADL's : Needs assistance/impaired                         Toilet Transfer: Minimal assistance;Ambulation;Regular Toilet   Toileting- Clothing Manipulation and Hygiene: Maximal assistance;Sit to/from stand       Functional mobility during ADLs: Minimal assistance General ADL Comments: Constant cues needed for sequencing and safety throughout with pt not following safety cues at all     Vision         Perception     Praxis      Pertinent Vitals/Pain Pain Assessment: No/denies pain     Hand Dominance Right   Extremity/Trunk Assessment Upper Extremity Assessment Upper Extremity Assessment: Overall WFL for tasks assessed           Communication Communication Communication: Receptive difficulties;Expressive difficulties   Cognition Arousal/Alertness: Awake/alert Behavior During Therapy: Agitated;Anxious;Impulsive;Restless;Flat affect Overall Cognitive Status: History of cognitive impairments - at baseline                                 General  Comments: resident of Memory Care, dementia at baseline   General Comments       Exercises     Shoulder Instructions      Home Living Family/patient expects to be discharged to:: Skilled nursing facility                                 Additional Comments: [facility]      Prior Functioning/Environment Level of Independence: Needs assistance        Comments: Pt with difficulty forming appropriate answers to questions, but talks throughout eval         OT Problem List:        OT Treatment/Interventions:      OT Goals(Current goals can be found in the care plan section) Acute Rehab OT Goals Patient Stated Goal: unable OT Goal Formulation: All assessment and education complete, DC therapy  OT Frequency:     Barriers to D/C:            Co-evaluation              AM-PAC OT "6 Clicks" Daily Activity     Outcome Measure Help from another person eating meals?: A Little Help from another person taking care of personal grooming?: A Little Help from another person toileting, which includes using toliet, bedpan, or urinal?: A Lot Help from another person bathing (including washing, rinsing, drying)?: A Lot Help from another person to put on and taking off regular upper body clothing?: A Lot Help from another person to put on and taking off regular lower body clothing?: A Lot 6 Click Score: 14   End of Session Equipment Utilized During Treatment: Gait belt;Rolling walker Nurse Communication: Mobility status;Other (comment)(agitation, restless)  Activity Tolerance: Treatment limited secondary to agitation;Treatment limited secondary to medical complications (Comment)(advanced dementia) Patient left: with nursing/sitter in room                   Time: 1451-1527 OT Time Calculation (min): 36 min Charges:  OT General Charges $OT Visit: 1 Visit OT Evaluation $OT Eval Moderate Complexity: 1 Mod OT Treatments $Self Care/Home Management : 8-22 mins  Lorre Munroe, OTR/L  Lorre Munroe 01/03/2020, 4:19 PM

## 2020-01-03 NOTE — TOC Progression Note (Signed)
Transition of Care [facility]) - Progression Note    Patient Details  Name: Raymond Snyder MRN: [id number] Date of Birth: 1941/05/11  Transition of Care [facility]) CM/SW Contact  Terrial Rhodes, LCSWA Phone Number: 01/03/2020, 2:52 PM  Clinical Narrative:     CSW called [facility] and left a message with the memory care unit regarding patient possibly returning to them tomorrow. CSW left name and number for them to call back.         Expected Discharge Plan and Services                                                 Social Determinants of Health (SDOH) Interventions    Readmission Risk Interventions No flowsheet data found.

## 2020-01-04 DIAGNOSIS — G301 Alzheimer's disease with late onset: Secondary | ICD-10-CM

## 2020-01-04 DIAGNOSIS — F0281 Dementia in other diseases classified elsewhere with behavioral disturbance: Secondary | ICD-10-CM

## 2020-01-04 LAB — BASIC METABOLIC PANEL
Anion gap: 11 (ref 5–15)
BUN: 17 mg/dL (ref 8–23)
CO2: 26 mmol/L (ref 22–32)
Calcium: 8.6 mg/dL — ABNORMAL LOW (ref 8.9–10.3)
Chloride: 102 mmol/L (ref 98–111)
Creatinine, Ser: 0.72 mg/dL (ref 0.61–1.24)
GFR calc Af Amer: >60 mL/min (ref >60)
GFR calc non Af Amer: >60 mL/min (ref >60)
Glucose, Bld: 98 mg/dL (ref 70–99)
Potassium: 4 mmol/L (ref 3.5–5.1)
Sodium: 139 mmol/L (ref 135–145)

## 2020-01-04 LAB — GLUCOSE, CAPILLARY
Glucose-Capillary: 88 mg/dL (ref 70–99)
Glucose-Capillary: 84 mg/dL (ref 70–99)

## 2020-01-04 MED ORDER — CLONAZEPAM 0.5 MG PO TABS: 0.2500 mg | ORAL_TABLET | Freq: Two times a day (BID) | ORAL | 0 refills | Status: AC

## 2020-01-04 MED ORDER — AMOXICILLIN-POT CLAVULANATE 875-125 MG PO TABS: 1.0000 | ORAL_TABLET | Freq: Two times a day (BID) | ORAL | 0 refills | Status: AC

## 2020-01-04 NOTE — Discharge Instructions (Signed)
1. Follow up with PCP in 1-2 weeks 2. Please obtain CMP/CBC in one week 3. Consider outpatient palliative care follow-up, consider outpatient speech therapist follow-up   Aspiration Pneumonia Aspiration pneumonia is an infection in the lungs. It occurs when saliva or liquid contaminated with bacteria is inhaled (aspirated) into the lungs. When these things get into the lungs, swelling (inflammation) and infection can occur. This can make it difficult to breathe. Aspiration pneumonia is a serious condition and can be life threatening. What are the causes? This condition is caused when saliva or liquid from the mouth, throat, or stomach is inhaled into the lungs, and when those fluids are contaminated with bacteria. What increases the risk? The following factors may make you more likely to develop this condition:  A narrowing of the tube that carries food to the stomach (esophageal narrowing).  Having gastroesophageal reflux disease (GERD).  Having a weak immune system.  Having diabetes.  Having poor oral hygiene.  Being malnourished. The condition is more likely to occur when a person's cough (gag) reflex, or ability to swallow, has decreased. Some things that can cause this decrease include:  Having a brain injury or disease, such as stroke, seizures, Parkinson disease, dementia, or amyotrophic lateral sclerosis (ALS).  Being given a general anesthetic for procedures.  Drinking too much alcohol. If a person passes out and vomits, vomit can be inhaled into the lungs.  Taking certain medicines, such as tranquilizers or sedatives. What are the signs or symptoms? Symptoms of this condition include:  Fever.  A cough with secretions that are yellow, tan, or green.  Breathing problems, such as wheezing or shortness of breath.  Chest pain.  Being more tired than usual (fatigue).  Having a history of coughing while eating or drinking.  Bad breath.  Bluish color to the lips,  skin, or fingers. How is this diagnosed? This condition may be diagnosed based on:  A physical exam.  Tests, such as: ? Chest X-ray. ? Sputum culture. Saliva and mucus (sputum) are collected from the lungs or the tubes that carry air to the lungs (bronchi). The sputum is then tested for bacteria. ? Oximetry. A sensor or clip is placed on areas such as a finger, earlobe, or toe to measure the oxygen level in your blood. ? Blood tests. ? Swallowing study. This test looks at how food is swallowed and whether it goes into your breathing tube (trachea) or esophagus. ? Bronchoscopy. This test uses a flexible tube (bronchoscope) to see inside the lungs. How is this treated? This condition may be treated with:  Medicines. Antibiotic medicine will be given to kill the pneumonia bacteria. Other medicines may also be used to reduce fever or pain.  Breathing assistance and oxygen therapy. Depending on how well you are breathing, you may need to be given oxygen, or you may need breathing support from a breathing machine (ventilator).  Thoracentesis. This is a procedure to remove fluid that has built up in the space between the linings of the chest wall and the lungs.  Feeding tube and diet change. For people who have difficulty swallowing, a feeding tube might be placed in the stomach, or they may be asked to avoid certain food textures or liquids when eating. Follow these instructions at home: Medicines  Take over-the-counter and prescription medicines only as told by your health care provider. ? If you were prescribed an antibiotic medicine, take it as told by your health care provider. Do not stop taking the antibiotic  even if you start to feel better. ? Take cough medicine only if you are losing sleep. Cough medicine can prevent your body's natural ability to remove mucus from your lungs. General instructions  Carefully follow any eating instructions you were given, such as avoiding certain  food textures or thickening your liquids. Thickening liquids reduces the risk of developing aspiration pneumonia again.  Use breathing exercises such as postural drainage, deep breathing, and incentive spirometry to help expel secretions.  Rest as instructed by your health care provider.  Sleep in a semi-upright position at night. Try to sleep in a reclining chair, or place a few pillows under your head.  Do not use any products that contain nicotine or tobacco, such as cigarettes and e-cigarettes. If you need help quitting, ask your health care provider.  Keep all follow-up visits as told by your health care provider. This is important. Contact a health care provider if:  You have a fever.  You have a worsening cough with yellow, tan, or green secretions.  You have coughing while eating or drinking. Get help right away if:  You have worsening shortness of breath, wheezing, or difficulty breathing.  You have chest pain. Summary  Aspiration pneumonia is an infection in the lungs. It is caused when saliva or liquid from the mouth, throat, or stomach is inhaled into the lungs.  Aspiration pneumonia is more likely to occur when a person's cough reflex or ability to swallow has decreased.  Symptoms of aspiration pneumonia include coughing, breathing problems, fever, and chest pain.  Aspiration pneumonia may be treated with antibiotic medicine, other medicines to reduce pain or fever, and breathing assistance or oxygen therapy. This information is not intended to replace advice given to you by your health care provider. Make sure you discuss any questions you have with your health care provider. Document Revised: 09/15/2017 Document Reviewed: 11/08/2016 Elsevier Patient Education  2020 [employer].

## 2020-01-04 NOTE — NC FL2 (Deleted)
[place] MEDICAID FL2 LEVEL OF CARE SCREENING TOOL     IDENTIFICATION  Patient Name: Raymond Snyder Birthdate: 02/10/1941 Sex: male Admission Date (Current Location): 01/01/2020  County and Medicaid Number:  [place]   Facility and Address:  The [facility], [address], [place], [place] [address]      Provider Number: [id number]  Attending Physician Name and Address:  Ghimire, Shanker M, MD  Relative Name and Phone Number:  Keri Polendo [phone]    Current Level of Care: Hospital Recommended Level of Care: Memory Care Prior Approval Number:    Date Approved/Denied:   PASRR Number:    Discharge Plan: Other (Comment)(memory care)    Current Diagnoses: Patient Active Problem List   Diagnosis Date Noted  . Palliative care by specialist   . Goals of care, counseling/discussion   . DNR (do not resuscitate)   . Late onset Alzheimer's disease with behavioral disturbance (HCC)   . Acute delirium   . Acute encephalopathy 01/02/2020  . Sepsis (HCC) 01/02/2020  . CAP (community acquired pneumonia) 01/01/2020    Orientation RESPIRATION BLADDER Height & Weight     Self(patient not oriented, has advanced dementia)    Incontinent Weight: 176 lb (79.8 kg) Height:  5' 10" (177.8 cm)  BEHAVIORAL SYMPTOMS/MOOD NEUROLOGICAL BOWEL NUTRITION STATUS      Continent Diet(Thickened Liquids-nectar thick liquids)  AMBULATORY STATUS COMMUNICATION OF NEEDS Skin     Non-Verbally Normal                       Personal Care Assistance Level of Assistance              Functional Limitations Info  Sight, Hearing, Speech Sight Info: Adequate Hearing Info: Adequate Speech Info: Impaired    SPECIAL CARE FACTORS FREQUENCY                       Contractures Contractures Info: Not present    Additional Factors Info  Code Status Code Status Info: DNR             Current Medications (01/04/2020):  Discharge Medications:     TAKE these  medications   amoxicillin-clavulanate 875-125 MG tablet Commonly known as: Augmentin Take 1 tablet by mouth 2 (two) times daily for 2 days.   aspirin EC 81 MG tablet Take 81 mg by mouth daily.   atorvastatin 80 MG tablet Commonly known as: LIPITOR Take 80 mg by mouth at bedtime.   carbamazepine 200 MG tablet Commonly known as: TEGRETOL Take 200 mg by mouth in the morning, at noon, and at bedtime.   clonazePAM 0.5 MG tablet Commonly known as: KLONOPIN Take 0.5 tablets (0.25 mg total) by mouth in the morning and at bedtime.   docusate 50 MG/5ML liquid Commonly known as: COLACE Take 100 mg by mouth daily as needed for mild constipation.   haloperidol 5 MG tablet Commonly known as: HALDOL Take 5-7.5 mg by mouth See admin instructions. Take 5mg twice daily and 7.5mg at bedtime.   memantine 10 MG tablet Commonly known as: NAMENDA Take 10 mg by mouth 2 (two) times daily.   multivitamin with minerals Tabs tablet Take 1 tablet by mouth daily.   OLANZapine 5 MG tablet Commonly known as: ZYPREXA Take 2.5 mg by mouth every 12 (twelve) hours as needed (agitation/aggression).   traZODone 100 MG tablet Commonly known as: DESYREL Take 100 mg by mouth at bedtime.          Relevant Imaging Results:  Relevant Lab Results:   Additional Information social not available  Raymond Snyder, [place], [place]

## 2020-01-04 NOTE — Progress Notes (Signed)
   01/04/20 1057  Family/Significant Other Communication  Family/Significant Other Update Called;Updated;Other (Comment) Lorina Rabon, pts daughter, [phone])

## 2020-01-04 NOTE — Progress Notes (Signed)
Palliative Medicine Inpatient Follow Up Note   HPI: Raymond Snyder a 79 y.o.malewithknown history of dementia hyperlipidemia was brought to the ER if patient became more confused than usual with fever. No further history is available since patient is confused and family at this time and on the bedside. Most of the history was obtained from ER physician.  Palliative care was asked to get involved to help with goals of care conversations. Patient has suffered from some dysphagia and recurrence of aspiration pneumonia. He has more advanced dementia and is not a good candidate for long term tube feeding options.   Today's Discussion (01/04/2020): Chart reviewed. Patient resting in the morning.Plan to transition back to [facility] today. Left information on caregiver support groups for Keri at bedside.   Discussed with patient the importance of continued conversation with family and their  medical providers regarding overall plan of care and treatment options, ensuring decisions are within the context of the patients values and GOCs.  Vital Signs Vitals:   01/03/20 2324 01/04/20 0814  BP: 124/63 (!) 162/87  Pulse: 65 71  Resp: 17 16  Temp: 98.2 F (36.8 C)   SpO2:  95%    Intake/Output Summary (Last 24 hours) at 01/04/2020 1429 Last data filed at 01/04/2020 1200 Gross per 24 hour  Intake 420 ml  Output 550 ml  Net -130 ml   Last Weight  Most recent update: 01/01/2020  9:38 PM   Weight  79.8 kg (176 lb)           Physical Exam Vitals and nursing note reviewed.  HENT:     Head: Normocephalic.     Nose: Nose normal.     Mouth/Throat:     Mouth: Mucous membranes are dry.  Eyes:     Pupils: Pupils are equal, round, and reactive to light.  Cardiovascular:     Rate and Rhythm: Regular rhythm.     Pulses: Normal pulses.  Pulmonary:     Effort: Pulmonary effort is normal.  Abdominal:     General: Abdomen is flat.     Palpations: Abdomen is soft.  Musculoskeletal:         General: Normal range of motion.     Cervical back: Normal range of motion.  Skin:    General: Skin is warm and dry.     Capillary Refill: Capillary refill takes less than 2 seconds.  Neurological:     Mental Status: He is alert. He is disoriented.   SUMMARY OF RECOMMENDATIONS   DNAR/DNI  MOST completed and placed in chart  Chaplain Support  Symptom Management:  Sepsis d/t Pneumonia:                 - Azithromycin/CTX                  - PRN O2                  Dysphagia:                 Per SLP Note                                  *Diet recommendations: Regular;Nectar-thick liquid(free water between meals)                                 *Medication  Administration: Whole meds with puree                                 *Supervision: Patient able to self feed;Full supervision/cueing for compensatory strategies                                 *Compensations: Slow rate;Small sips/bites(pinch off straw if needed to control bolus amounts)                                 *Postural Changes and/or Swallow Maneuvers: Seated upright 90 degrees;Upright 30-60 min after meal Dementia:                 - Namenda 10mg  PO BID  Delirium:                 - 1:1 sitter - Delirium precautions - Get up during the day - Encourage a familiar face to remain present throughout the day - Keep blinds open and lights on during daylight hours - Minimize the use of opioids/benzodiazepines                                                                  *Agitated delirium started on                                                  Haldol 5mg  BID                                                      Haldol 7.5mg  PO QHS                                                 Haldol 1mg  IV Q6H PRN                                                 Olanzapine 2.5mg  PO BID PRN   Insomnia:                 - Trazodone 100mg  PO QHS                  - Clonazepam initiated while hospitalization 0.25mg  PO QHS  Time Spent: 15 Greater than 50% of the time was spent in counseling and coordination of care ______________________________________________________________________________________ [place] Palliative Medicine Team Team Cell Phone: [phone] Please utilize secure chat with additional questions, if there is no response within 30 minutes please call the above phone number  Palliative Medicine Team providers are available by phone from 7am to 7pm daily and can be reached through the team cell phone.  Should this patient require assistance outside of these hours, please call the patient's attending physician.

## 2020-01-04 NOTE — NC FL2 (Addendum)
[place] LEVEL OF CARE SCREENING TOOL     IDENTIFICATION  Patient Name: Raymond Snyder Birthdate: 1941/02/04 Sex: male Admission Date (Current Location): 01/01/2020  [facility] and [place] Number:  Herbalist and Address:  [facility], [place] [address], [place], [place] [address]      Provider Number: [id number]  Attending Physician Name and Address:  Jonetta Osgood, MD  Relative Name and Phone Number:  Anthoney Sheppard [phone]    Current Level of Care: Hospital Recommended Level of Care: Memory Care Prior Approval Number:    Date Approved/Denied:   PASRR Number:    Discharge Plan: Other (Comment)(memory care)    Current Diagnoses: Patient Active Problem List   Diagnosis Date Noted  . Palliative care by specialist   . Goals of care, counseling/discussion   . DNR (do not resuscitate)   . Late onset Alzheimer's disease with behavioral disturbance ([place])   . Acute delirium   . Acute encephalopathy 01/02/2020  . Sepsis ([place]) 01/02/2020  . CAP (community acquired pneumonia) 01/01/2020    Orientation RESPIRATION BLADDER Height & Weight     Self(patient not oriented, has advanced dementia)    Incontinent Weight: 176 lb (79.8 kg) Height:  5\' 10"  (177.8 cm)  BEHAVIORAL SYMPTOMS/MOOD NEUROLOGICAL BOWEL NUTRITION STATUS      Continent Diet(Thickened Liquids-nectar thick liquids)  AMBULATORY STATUS COMMUNICATION OF NEEDS Skin     Non-Verbally Normal                       Personal Care Assistance Level of Assistance              Functional Limitations Info  Sight, Hearing, Speech Sight Info: Adequate Hearing Info: Adequate Speech Info: Impaired    SPECIAL CARE FACTORS FREQUENCY                       Contractures Contractures Info: Not present    Additional Factors Info  Code Status Code Status Info: DNR             Current Medications (01/04/2020):  Discharge Medications:     TAKE these  medications   amoxicillin-clavulanate 875-125 MG tablet Commonly known as: Augmentin Take 1 tablet by mouth 2 (two) times daily for 2 days.   aspirin EC 81 MG tablet Take 81 mg by mouth daily.   atorvastatin 80 MG tablet Commonly known as: LIPITOR Take 80 mg by mouth at bedtime.   carbamazepine 200 MG tablet Commonly known as: TEGRETOL Take 200 mg by mouth in the morning, at noon, and at bedtime.   clonazePAM 0.5 MG tablet Commonly known as: KLONOPIN Take 0.5 tablets (0.25 mg total) by mouth in the morning and at bedtime.   docusate 50 MG/5ML liquid Commonly known as: COLACE Take 100 mg by mouth daily as needed for mild constipation.   haloperidol 5 MG tablet Commonly known as: HALDOL Take 5-7.5 mg by mouth See admin instructions. Take 5mg  twice daily and 7.5mg  at bedtime.   memantine 10 MG tablet Commonly known as: NAMENDA Take 10 mg by mouth 2 (two) times daily.   multivitamin with minerals Tabs tablet Take 1 tablet by mouth daily.   OLANZapine 5 MG tablet Commonly known as: ZYPREXA Take 2.5 mg by mouth every 12 (twelve) hours as needed (agitation/aggression).   traZODone 100 MG tablet Commonly known as: DESYREL Take 100 mg by mouth at bedtime.  Relevant Imaging Results:  Relevant Lab Results:   Additional Information social not available  Marcellous Snarski, [place], [place]

## 2020-01-04 NOTE — TOC Progression Note (Signed)
Transition of Care [facility]) - Progression Note    Patient Details  Name: Raymond Snyder MRN: [id number] Date of Birth: 06/17/1941  [facility]) CM/SW Contact  [address], [place], [place] Phone Number: 01/04/2020, 11:46 AM  Clinical Narrative:    Patient to discharge back to [facility]. Discharge summary, COVID test and Fl2 faxed to [facility]. Patient's daughter contacted and she agreed to transport patient back to [facility] today at approximately 1:30pm. Patient's daughter verbalized having no further needs at this time.   Rody Keadle, LCSW Clinical Social Work [phone]           Expected Discharge Plan and Services           Expected Discharge Date: 01/04/20                                     Social Determinants of Health (SDOH) Interventions    Readmission Risk Interventions No flowsheet data found.

## 2020-01-04 NOTE — Progress Notes (Signed)
AVS, scripts, and follow-up recommendations reviewed over the telephone with patient's daughter Lorina Rabon.  Patient's IV removed without issue and pt dressed in disposable scrubs.  Patient's daughter Lorina Rabon is driving her father, the patient, via private vehicle to [facility]. Patient being wheeled via wheelchair to main lobby exit.

## 2020-01-04 NOTE — Progress Notes (Signed)
PT Cancellation Note  Patient Details Name: Raymond Snyder MRN: [id number] DOB: 12-05-1940   Cancelled Treatment:    Reason Eval/Treat Not Completed: Other (comment). Aware of new PT order however pt was evaled on Thursday 3/18 and due to advanced dementia pt was unable to participate in therapy due to inability to follow commands and agitation. Pt resides in memory care unit at [facility]. Palliative is involved and in the process of assisting pt in d/c back to [facility]. Pt with no skilled acute PT needs at this time. PT not to re-evaluate this patient.  Raymond Snyder, PT, DPT Acute Rehabilitation Services Pager #: [phone] Office #: [phone]    [name] 01/04/2020, 7:09 AM

## 2020-01-04 NOTE — Discharge Summary (Signed)
PATIENT DETAILS Name: Raymond Snyder Age: 79 y.o. Sex: male Date of Birth: 1941-04-17 MRN: [id number]. Admitting Physician: Raymond Patience, MD [id number], Pcp Not In  Admit Date: 01/01/2020 Discharge date: 01/04/2020  Recommendations for Outpatient Follow-up:  1. Follow up with PCP in 1-2 weeks 2. Please obtain CMP/CBC in one week 3. Consider outpatient palliative care follow-up, consider outpatient SLP follow-up  Admitted From:  Memory care  Disposition: Memory care   Home Health: No  Equipment/Devices: None  Discharge Condition: Stable  CODE STATUS:  DNR  Diet recommendation:  Diet Order            Diet - low sodium heart healthy        Diet regular Room service appropriate? No; Fluid consistency: Nectar Thick  Diet effective now                Brief Narrative: Patient is a 79 y.o. male with past medical history of dementia, HLD-brought from his memory care unit for fever and confusion-found to have aspiration pneumonitis.  Significant events: 3/18>> admit for aspiration pneumonia  Antimicrobial therapy: Rocephin: 3/17>>3/20 Zithromax: 3/17>>3/20 Augmentin:3/20>>  Microbiology data: 3/17>> blood cultures: Negative 3/17>> urine culture: No growth 3/17>> Covid/influenza negative.  Procedures : None  Consults: Palliative care  Brief Hospital Course: Sepsis secondary to aspiration pneumonia (POA): Sepsis physiology has resolved-blood cultures negative-treated with IV antibiotics-we will transition to Augmentin on discharge.     Dysphagia: Probably secondary to dementia-appreciate SLP eval-started on regular diet.  May benefit from outpatient periodic SLP evaluation  Dementia with superimposed acute metabolic encephalopathy secondary to sepsis/aspiration pneumonia:  Encephalopathy was from sepsis-he is much better than how he first presented to the hospital with-he remains very pleasantly confused-I suspect he is at baseline.    Hypokalemia: Repleted   Discharge Instructions:  Activity:  As tolerated with Full fall precautions use walker/cane & assistance as needed  Discharge Instructions    Call MD for:  difficulty breathing, headache or visual disturbances   Complete by: As directed    Diet - low sodium heart healthy   Complete by: As directed    Increase activity slowly   Complete by: As directed      Allergies as of 01/04/2020   Not on File     Medication List    TAKE these medications   amoxicillin-clavulanate 875-125 MG tablet Commonly known as: Augmentin Take 1 tablet by mouth 2 (two) times daily for 2 days.   aspirin EC 81 MG tablet Take 81 mg by mouth daily.   atorvastatin 80 MG tablet Commonly known as: LIPITOR Take 80 mg by mouth at bedtime.   carbamazepine 200 MG tablet Commonly known as: TEGRETOL Take 200 mg by mouth in the morning, at noon, and at bedtime.   clonazePAM 0.5 MG tablet Commonly known as: KLONOPIN Take 0.5 tablets (0.25 mg total) by mouth in the morning and at bedtime.   docusate 50 MG/5ML liquid Commonly known as: COLACE Take 100 mg by mouth daily as needed for mild constipation.   haloperidol 5 MG tablet Commonly known as: HALDOL Take 5-7.5 mg by mouth See admin instructions. Take 5mg  twice daily and 7.5mg  at bedtime.   memantine 10 MG tablet Commonly known as: NAMENDA Take 10 mg by mouth 2 (two) times daily.   multivitamin with minerals Tabs tablet Take 1 tablet by mouth daily.   OLANZapine 5 MG tablet Commonly known as: ZYPREXA Take 2.5 mg by mouth every 12 (twelve) hours as needed (  agitation/aggression).   traZODone 100 MG tablet Commonly known as: DESYREL Take 100 mg by mouth at bedtime.       Not on File   Other Procedures/Studies: DG Chest Port 1 View  Result Date: 01/01/2020 CLINICAL DATA:  Fever EXAM: PORTABLE CHEST 1 VIEW COMPARISON:  None. FINDINGS: Left lung grossly clear. Airspace disease at the right middle lobe and  right base. Normal heart size. No pneumothorax IMPRESSION: Airspace disease at the right middle lobe and right base concerning for pneumonia or aspiration. Radiographic follow-up to resolution is recommended Electronically Signed   By: Raymond Snyder M.D.   On: 01/01/2020 22:01   DG Swallowing Func-Speech Pathology  Result Date: 01/02/2020 Objective Swallowing Evaluation: Type of Study: Bedside Swallow Evaluation  Patient Details Name: Raymond Snyder MRN: [id number] Date of Birth: [birth year] Today's Date: 01/02/2020 Time: SLP Start Time (ACUTE ONLY): 1148 -SLP Stop Time (ACUTE ONLY): 1212 SLP Time Calculation (min) (ACUTE ONLY): 24 min Past Medical History: Past Medical History: Diagnosis Date . Dementia [facility])  Past Surgical History: No past surgical history on file. HPI: [name], 78y/m, presented to ER due to more confusion and fever. PMH of advanced dementia and HLD. Chest x-ray shows infiltrates concerning for pneumonia. No prior knowledge of dysphagia.   Subjective: confused Assessment / Plan / Recommendation CHL IP CLINICAL IMPRESSIONS 01/02/2020 Clinical Impression Modified Barium Swallow completed after bedside swallow evaluation showed signs of dysphagia. Patient has mild-moderate oropharyngeal dysphagia characterized by delayed swallow to pyriform sinuses, reduced laryngeal anterior movement resulting in poor cricopharyngeal opening and moderate pharyngeal residue. With additional boluses pharyngeal residue continued to build and result in spill over into laryngeal vestibule. Aspiration to the cords, pt not sensate and no cough. Pt was unable to cough when cued. Pt also unable to follow commands for a chin tuck modification or multiple swallow. Patient is at a moderate to high risk for continued aspiration due to pharyngeal residue and inability to clear through multiple swallows. Recommend pt remain NPO with sips and chips for comfort. Medication crushed in small amounts of puree.  ST to follow up  to determine if pt improves and is able to actively follow commands for compensatory strategies and swallow tx. May need to consider an alternative means for nutrition.  SLP Visit Diagnosis Dysphagia, oropharyngeal phase (R13.12) Attention and concentration deficit following -- Frontal lobe and executive function deficit following -- Impact on safety and function --   CHL IP TREATMENT RECOMMENDATION 01/02/2020 Treatment Recommendations Therapy as outlined in treatment plan below   Prognosis 01/02/2020 Prognosis for Safe Diet Advancement Guarded Barriers to Reach Goals Cognitive deficits Barriers/Prognosis Comment -- CHL IP DIET RECOMMENDATION 01/02/2020 SLP Diet Recommendations NPO;Ice chips PRN after oral care;NPO except meds;Free water protocol after oral care Liquid Administration via Cup Medication Administration Crushed with puree Compensations Slow rate;Small sips/bites Postural Changes Seated upright at 90 degrees   CHL IP OTHER RECOMMENDATIONS 01/02/2020 Recommended Consults -- Oral Care Recommendations Oral care QID Other Recommendations --   CHL IP FOLLOW UP RECOMMENDATIONS 01/02/2020 Follow up Recommendations Skilled Nursing facility   [facility] IP FREQUENCY AND DURATION 01/02/2020 Speech Therapy Frequency (ACUTE ONLY) min 2x/week Treatment Duration 2 weeks      CHL IP ORAL PHASE 01/02/2020 Oral Phase Impaired Oral - Pudding Teaspoon -- Oral - Pudding Cup -- Oral - Honey Teaspoon -- Oral - Honey Cup -- Oral - Nectar Teaspoon -- Oral - Nectar Cup -- Oral - Nectar Straw -- Oral - Thin Teaspoon -- Oral -  Thin Cup -- Oral - Thin Straw -- Oral - Puree -- Oral - Mech Soft Lingual/palatal residue;Delayed oral transit Oral - Regular -- Oral - Multi-Consistency -- Oral - Pill -- Oral Phase - Comment --  CHL IP PHARYNGEAL PHASE 01/02/2020 Pharyngeal Phase Impaired Pharyngeal- Pudding Teaspoon -- Pharyngeal -- Pharyngeal- Pudding Cup -- Pharyngeal -- Pharyngeal- Honey Teaspoon -- Pharyngeal -- Pharyngeal- Honey Cup -- Pharyngeal  -- Pharyngeal- Nectar Teaspoon -- Pharyngeal -- Pharyngeal- Nectar Cup -- Pharyngeal -- Pharyngeal- Nectar Straw -- Pharyngeal -- Pharyngeal- Thin Teaspoon -- Pharyngeal -- Pharyngeal- Thin Cup Delayed swallow initiation-pyriform sinuses;Reduced anterior laryngeal mobility;Reduced laryngeal elevation;Pharyngeal residue - pyriform;Pharyngeal residue - posterior pharnyx;Penetration/Apiration after swallow;Moderate aspiration Pharyngeal Material enters airway, CONTACTS cords and not ejected out;Material enters airway, passes BELOW cords without attempt by patient to eject out (silent aspiration) Pharyngeal- Thin Straw -- Pharyngeal -- Pharyngeal- Puree Delayed swallow initiation-vallecula;Reduced anterior laryngeal mobility;Pharyngeal residue - pyriform;Pharyngeal residue - posterior pharnyx Pharyngeal -- Pharyngeal- Mechanical Soft Delayed swallow initiation-vallecula;Reduced anterior laryngeal mobility;Pharyngeal residue - pyriform;Pharyngeal residue - posterior pharnyx Pharyngeal -- Pharyngeal- Regular -- Pharyngeal -- Pharyngeal- Multi-consistency -- Pharyngeal -- Pharyngeal- Pill -- Pharyngeal -- Pharyngeal Comment --  CHL IP CERVICAL ESOPHAGEAL PHASE 01/02/2020 Cervical Esophageal Phase Impaired Pudding Teaspoon -- Pudding Cup -- Honey Teaspoon -- Honey Cup -- Nectar Teaspoon -- Nectar Cup -- Nectar Straw -- Thin Teaspoon Reduced cricopharyngeal relaxation;Prominent cricopharyngeal segment Thin Cup Reduced cricopharyngeal relaxation;Prominent cricopharyngeal segment Thin Straw -- Puree -- Mechanical Soft -- Regular -- Multi-consistency -- Pill -- Cervical Esophageal Comment -- Raymond AbedSarah W., Raymond Snyder 01/02/2020, 2:16 PM                TODAY-DAY OF DISCHARGE:  Subjective:   Raymond RamusJerry Snyder today remains very pleasantly confused.  No sitter at bedside.  Objective:   Blood pressure (!) 162/87, pulse 71, temperature 98.2 F (36.8 C), temperature source Axillary, resp. rate 16, height 5\' 10"  (1.778 m), weight  79.8 kg, SpO2 95 %.  Intake/Output Summary (Last 24 hours) at 01/04/2020 1055 Last data filed at 01/04/2020 0959 Gross per 24 hour  Intake 830 ml  Output 550 ml  Net 280 ml   Filed Weights   01/01/20 2138  Weight: 79.8 kg    Exam: Awake-pleasantly confused, No new F.N deficits, Normal affect [place].AT,PERRAL Supple Neck,No JVD, No cervical lymphadenopathy appriciated.  Symmetrical Chest wall movement, Good air movement bilaterally, CTAB RRR,No Gallops,Rubs or new Murmurs, No Parasternal Heave +ve B.Sounds, Abd Soft, Non tender, No organomegaly appriciated, No rebound -guarding or rigidity. No Cyanosis, Clubbing or edema, No new Rash or bruise   PERTINENT RADIOLOGIC STUDIES: DG Swallowing Func-Speech Pathology  Result Date: 01/02/2020 Objective Swallowing Evaluation: Type of Study: Bedside Swallow Evaluation  Patient Details Name: Raymond RamusJerry Snyder MRN: [id number] Date of Birth: 05/06/1941 Today's Date: 01/02/2020 Time: SLP Start Time (ACUTE ONLY): 1148 -SLP Stop Time (ACUTE ONLY): 1212 SLP Time Calculation (min) (ACUTE ONLY): 24 min Past Medical History: Past Medical History: Diagnosis Date . Dementia [facility](H[facility])  Past Surgical History: No past surgical history on file. HPI: Mr Raymond RamusJerry Deller, 78y/m, presented to ER due to more confusion and fever. PMH of advanced dementia and HLD. Chest x-ray shows infiltrates concerning for pneumonia. No prior knowledge of dysphagia.   Subjective: confused Assessment / Plan / Recommendation CHL IP CLINICAL IMPRESSIONS 01/02/2020 Clinical Impression Modified Barium Swallow completed after bedside swallow evaluation showed signs of dysphagia. Patient has mild-moderate oropharyngeal dysphagia characterized by delayed swallow to pyriform sinuses, reduced laryngeal anterior movement resulting in  poor cricopharyngeal opening and moderate pharyngeal residue. With additional boluses pharyngeal residue continued to build and result in spill over into laryngeal vestibule. Aspiration  to the cords, pt not sensate and no cough. Pt was unable to cough when cued. Pt also unable to follow commands for a chin tuck modification or multiple swallow. Patient is at a moderate to high risk for continued aspiration due to pharyngeal residue and inability to clear through multiple swallows. Recommend pt remain NPO with sips and chips for comfort. Medication crushed in small amounts of puree.  ST to follow up to determine if pt improves and is able to actively follow commands for compensatory strategies and swallow tx. May need to consider an alternative means for nutrition.  SLP Visit Diagnosis Dysphagia, oropharyngeal phase (R13.12) Attention and concentration deficit following -- Frontal lobe and executive function deficit following -- Impact on safety and function --   CHL IP TREATMENT RECOMMENDATION 01/02/2020 Treatment Recommendations Therapy as outlined in treatment plan below   Prognosis 01/02/2020 Prognosis for Safe Diet Advancement Guarded Barriers to Reach Goals Cognitive deficits Barriers/Prognosis Comment -- CHL IP DIET RECOMMENDATION 01/02/2020 SLP Diet Recommendations NPO;Ice chips PRN after oral care;NPO except meds;Free water protocol after oral care Liquid Administration via Cup Medication Administration Crushed with puree Compensations Slow rate;Small sips/bites Postural Changes Seated upright at 90 degrees   CHL IP OTHER RECOMMENDATIONS 01/02/2020 Recommended Consults -- Oral Care Recommendations Oral care QID Other Recommendations --   CHL IP FOLLOW UP RECOMMENDATIONS 01/02/2020 Follow up Recommendations Skilled Nursing facility   [facility] IP FREQUENCY AND DURATION 01/02/2020 Speech Therapy Frequency (ACUTE ONLY) min 2x/week Treatment Duration 2 weeks      CHL IP ORAL PHASE 01/02/2020 Oral Phase Impaired Oral - Pudding Teaspoon -- Oral - Pudding Cup -- Oral - Honey Teaspoon -- Oral - Honey Cup -- Oral - Nectar Teaspoon -- Oral - Nectar Cup -- Oral - Nectar Straw -- Oral - Thin Teaspoon -- Oral - Thin  Cup -- Oral - Thin Straw -- Oral - Puree -- Oral - Mech Soft Lingual/palatal residue;Delayed oral transit Oral - Regular -- Oral - Multi-Consistency -- Oral - Pill -- Oral Phase - Comment --  CHL IP PHARYNGEAL PHASE 01/02/2020 Pharyngeal Phase Impaired Pharyngeal- Pudding Teaspoon -- Pharyngeal -- Pharyngeal- Pudding Cup -- Pharyngeal -- Pharyngeal- Honey Teaspoon -- Pharyngeal -- Pharyngeal- Honey Cup -- Pharyngeal -- Pharyngeal- Nectar Teaspoon -- Pharyngeal -- Pharyngeal- Nectar Cup -- Pharyngeal -- Pharyngeal- Nectar Straw -- Pharyngeal -- Pharyngeal- Thin Teaspoon -- Pharyngeal -- Pharyngeal- Thin Cup Delayed swallow initiation-pyriform sinuses;Reduced anterior laryngeal mobility;Reduced laryngeal elevation;Pharyngeal residue - pyriform;Pharyngeal residue - posterior pharnyx;Penetration/Apiration after swallow;Moderate aspiration Pharyngeal Material enters airway, CONTACTS cords and not ejected out;Material enters airway, passes BELOW cords without attempt by patient to eject out (silent aspiration) Pharyngeal- Thin Straw -- Pharyngeal -- Pharyngeal- Puree Delayed swallow initiation-vallecula;Reduced anterior laryngeal mobility;Pharyngeal residue - pyriform;Pharyngeal residue - posterior pharnyx Pharyngeal -- Pharyngeal- Mechanical Soft Delayed swallow initiation-vallecula;Reduced anterior laryngeal mobility;Pharyngeal residue - pyriform;Pharyngeal residue - posterior pharnyx Pharyngeal -- Pharyngeal- Regular -- Pharyngeal -- Pharyngeal- Multi-consistency -- Pharyngeal -- Pharyngeal- Pill -- Pharyngeal -- Pharyngeal Comment --  CHL IP CERVICAL ESOPHAGEAL PHASE 01/02/2020 Cervical Esophageal Phase Impaired Pudding Teaspoon -- Pudding Cup -- Honey Teaspoon -- Honey Cup -- Nectar Teaspoon -- Nectar Cup -- Nectar Straw -- Thin Teaspoon Reduced cricopharyngeal relaxation;Prominent cricopharyngeal segment Thin Cup Reduced cricopharyngeal relaxation;Prominent cricopharyngeal segment Thin Straw -- Puree -- Mechanical  Soft -- Regular -- Multi-consistency -- Pill -- Cervical Esophageal  Comment -- Raymond Abed., Raymond Snyder 01/02/2020, 2:16 PM                PERTINENT LAB RESULTS: CBC: Recent Labs    01/02/20 0535 01/03/20 0409  WBC 14.4* 12.1*  HGB 11.6* 12.2*  HCT 34.1* 36.4*  PLT 131* 139*   CMET CMP     Component Value Date/Time   NA 139 01/04/2020 0527   K 4.0 01/04/2020 0527   CL 102 01/04/2020 0527   CO2 26 01/04/2020 0527   GLUCOSE 98 01/04/2020 0527   BUN 17 01/04/2020 0527   CREATININE 0.72 01/04/2020 0527   CALCIUM 8.6 (L) 01/04/2020 0527   PROT 5.5 (L) 01/02/2020 0535   ALBUMIN 2.8 (L) 01/02/2020 0535   AST 54 (H) 01/02/2020 0535   ALT 29 01/02/2020 0535   ALKPHOS 85 01/02/2020 0535   BILITOT 0.7 01/02/2020 0535   GFRNONAA >60 01/04/2020 0527   GFRAA >60 01/04/2020 0527    GFR Estimated Creatinine Clearance: 81.1 mL/min (by C-G formula based on SCr of 0.72 mg/dL). No results for input(s): LIPASE, AMYLASE in the last 72 hours. No results for input(s): CKTOTAL, CKMB, CKMBINDEX, TROPONINI in the last 72 hours. Invalid input(s): POCBNP No results for input(s): DDIMER in the last 72 hours. No results for input(s): HGBA1C in the last 72 hours. No results for input(s): CHOL, HDL, LDLCALC, TRIG, CHOLHDL, LDLDIRECT in the last 72 hours. No results for input(s): TSH, T4TOTAL, T3FREE, THYROIDAB in the last 72 hours.  Invalid input(s): FREET3 No results for input(s): VITAMINB12, FOLATE, FERRITIN, TIBC, IRON, RETICCTPCT in the last 72 hours. Coags: Recent Labs    01/01/20 2135  INR 1.1   Microbiology: Recent Results (from the past 240 hour(s))  Blood Culture (routine x 2)     Status: None (Preliminary result)   Collection Time: 01/01/20  9:35 PM   Specimen: BLOOD LEFT FOREARM  Result Value Ref Range Status   Specimen Description BLOOD LEFT FOREARM  Final   Special Requests   Final    BOTTLES DRAWN AEROBIC AND ANAEROBIC Blood Culture adequate volume   Culture   Final    NO  GROWTH 3 DAYS Performed at [facility], [address], [place], [place] [address]    Report Status PENDING  Incomplete  Blood Culture (routine x 2)     Status: None (Preliminary result)   Collection Time: 01/01/20  9:35 PM   Specimen: BLOOD  Result Value Ref Range Status   Specimen Description BLOOD LEFT ANTECUBITAL  Final   Special Requests   Final    BOTTLES DRAWN AEROBIC AND ANAEROBIC Blood Culture adequate volume   Culture   Final    NO GROWTH 3 DAYS Performed at [facility], [address] [place], [place] [address]    Report Status PENDING  Incomplete  Respiratory Panel by RT PCR (Flu A&B, Covid) - Nasopharyngeal Swab     Status: None   Collection Time: 01/01/20  9:35 PM   Specimen: Nasopharyngeal Swab  Result Value Ref Range Status   SARS Coronavirus 2 by RT PCR NEGATIVE NEGATIVE Final    Comment: (NOTE) SARS-CoV-2 target nucleic acids are NOT DETECTED. The SARS-CoV-2 RNA is generally detectable in upper respiratoy specimens during the acute phase of infection. The lowest concentration of SARS-CoV-2 viral copies this assay can detect is 131 copies/mL. A negative result does not preclude SARS-Cov-2 infection and should not be used as the sole basis for treatment or other patient  management decisions. A negative result may occur with  improper specimen collection/handling, submission of specimen other than nasopharyngeal swab, presence of viral mutation(s) within the areas targeted by this assay, and inadequate number of viral copies (<131 copies/mL). A negative result must be combined with clinical observations, patient history, and epidemiological information. The expected result is Negative. Fact Sheet for Patients:  https://www.moore.com/ Fact Sheet for Healthcare Providers:  https://www.young.biz/ This test is not yet ap proved or cleared by the [place] FDA and  has been authorized for detection and/or  diagnosis of SARS-CoV-2 by FDA under an Emergency Use Authorization (EUA). This EUA will remain  in effect (meaning this test can be used) for the duration of the COVID-19 declaration under Section 564(b)(1) of the Act, 21 U.S.C. section 360bbb-3(b)(1), unless the authorization is terminated or revoked sooner.    Influenza A by PCR NEGATIVE NEGATIVE Final   Influenza B by PCR NEGATIVE NEGATIVE Final    Comment: (NOTE) The Xpert Xpress SARS-CoV-2/FLU/RSV assay is intended as an aid in  the diagnosis of influenza from Nasopharyngeal swab specimens and  should not be used as a sole basis for treatment. Nasal washings and  aspirates are unacceptable for Xpert Xpress SARS-CoV-2/FLU/RSV  testing. Fact Sheet for Patients: https://www.moore.com/ Fact Sheet for Healthcare Providers: https://www.young.biz/ This test is not yet approved or cleared by the [place] FDA and  has been authorized for detection and/or diagnosis of SARS-CoV-2 by  FDA under an Emergency Use Authorization (EUA). This EUA will remain  in effect (meaning this test can be used) for the duration of the  Covid-19 declaration under Section 564(b)(1) of the Act, 21  U.S.C. section 360bbb-3(b)(1), unless the authorization is  terminated or revoked. Performed at [facility], [address], [place], [place] [address]   Urine culture     Status: None   Collection Time: 01/01/20 10:11 PM   Specimen: In/Out Cath Urine  Result Value Ref Range Status   Specimen Description IN/OUT CATH URINE  Final   Special Requests NONE  Final   Culture   Final    NO GROWTH Performed at [facility], [address] [place], [place] [address]    Report Status 01/03/2020 FINAL  Final    FURTHER DISCHARGE INSTRUCTIONS:  Get Medicines reviewed and adjusted: Please take all your medications with you for your next visit with your Primary MD  Laboratory/radiological data: Please  request your Primary MD to go over all hospital tests and procedure/radiological results at the follow up, please ask your Primary MD to get all Hospital records sent to his/her office.  In some cases, they will be blood work, cultures and biopsy results pending at the time of your discharge. Please request that your primary care M.D. goes through all the records of your hospital data and follows up on these results.  Also Note the following: If you experience worsening of your admission symptoms, develop shortness of breath, life threatening emergency, suicidal or homicidal thoughts you must seek medical attention immediately by calling 911 or calling your MD immediately  if symptoms less severe.  You must read complete instructions/literature along with all the possible adverse reactions/side effects for all the Medicines you take and that have been prescribed to you. Take any new Medicines after you have completely understood and accpet all the possible adverse reactions/side effects.   Do not drive when taking Pain medications or sleeping medications (Benzodaizepines)  Do not take more than prescribed Pain, Sleep  and Anxiety Medications. It is not advisable to combine anxiety,sleep and pain medications without talking with your primary care practitioner  Special Instructions: If you have smoked or chewed Tobacco  in the last 2 yrs please stop smoking, stop any regular Alcohol  and or any Recreational drug use.  Wear Seat belts while driving.  Please note: You were cared for by a hospitalist during your hospital stay. Once you are discharged, your primary care physician will handle any further medical issues. Please note that NO REFILLS for any discharge medications will be authorized once you are discharged, as it is imperative that you return to your primary care physician (or establish a relationship with a primary care physician if you do not have one) for your post hospital discharge needs  so that they can reassess your need for medications and monitor your lab values.  Total Time spent coordinating discharge including counseling, education and face to face time equals  45 minutes.  SignedJeoffrey Massed 01/04/2020 10:55 AM

## 2020-01-04 NOTE — Progress Notes (Signed)
Discharge report and AVS reviewed with [facility], nurse Helper, at [phone].  Dee denied further questions or concerns at present time.  Patient ready for discharge.

## 2020-01-05 LAB — LEGIONELLA PNEUMOPHILA SEROGP 1 UR AG: L. pneumophila Serogp 1 Ur Ag: NEGATIVE

## 2020-01-06 LAB — CULTURE, BLOOD (ROUTINE X 2)
Culture: NO GROWTH
Culture: NO GROWTH
Special Requests: ADEQUATE
Special Requests: ADEQUATE

## 2020-01-08 NOTE — Progress Notes (Signed)
March 25th, 2021 [facility] Palliative Care Consult Note Telephone: [phone]  Fax: [phone]  PATIENT NAME: Raymond Snyder DOB: 1941/01/12 MRN: [id number] [facility] [id number]) Move in date 11/23/2019  PRIMARY CARE PROVIDER:   Dr. Florentina Jenny (O) [phone], [phone]  REFERRING PROVIDER:  No referring provider defined for this encounter.  RESPONSIBLE PARTY:   Raymond Snyder, Raymond Snyder (Daughter/HCPOA) [phone] (Mobile). [email or web address]    ASSESSMENT / RECOMMENDATIONS:  1. Advance Care Planning: A. Directives: (completed in hospital) Forms in facility chart. DNR. MOST: DNR/DNI, Limited interventions. IVFs and Antibiotics: determine at the time / limited time trial. No feeding tube.  B. Goals of Care: For patient to be a happy and healthy within the facility as is possible  2. Cognitive / Functional status: FAST 7a. -Daughter reports rapid cognitive decline since 2018.. Constantly confused; responds to his name. Able to follow simple commands (take a deep breath) but unable to cognate placing marker on correct number on BINGO card. No behavior management issues save for wandering. Staff report no aggressive behaviors. Can speak clearly but off topic. Able to maintain eye contact.  -Patient's weight is 156.4 lbs; stable over the last month. At a height of 5'10" his BMI is 22.4kg/m2. He is currently on nectar thickened liquids and mechanical soft diet d/t recent hospitalization for aspiration pneumonia. Staff report no signs of coughing/choking with food/fluid. Very good appetite; able to feed himself.  -Ambulating and transferring without assistance. He wanders with an unsteady gait but he is unable to cognate use of cane or walker. He needs assist with hygiene and dressing. Staff report very good appetite. He is incontinent of urine; urinates into inappropriate spots as well as into the toilet. Continent of bowel with toileting  regimen.  3. Family Supports: (dtr/HCPOA) Raymond Snyder. Staff report that family recently attempted a window visit, but patient kept wandering away. Family calls periodically to speak with patient, but he is unable to cognate who they are. I spoke with care on her cell and updated her with today's visit. She requested that I send her a copy of the patient's medication list.  4. Follow up Palliative Care Visit: in 2-3 months (June 2021)  I spent 60 minutes providing this consultation from 9am to 10am. More than 50% of the time in this consultation was spent coordinating communication.   HISTORY OF PRESENT ILLNESS:  Kobee Medlen is a 79 y.o. male with h/o dementia (facility memory care). Past h/o behaviors (agitation) with past admissions to [facility] facility (bad s/e to [place] in past). -3/17-3/20/2021: possible sepsis 2/2 aspiration pneumonia, dysphagia (SLP regular diet), encephalopathy (improved) Palliative Care was asked to help address goals of care.   CODE STATUS: DNR  PPS: 30% HOSPICE ELIGIBILITY/DIAGNOSIS: TBD  PAST MEDICAL HISTORY:  Past Medical History:  Diagnosis Date  . Dementia (HCC)     SOCIAL HX:  Social History   Tobacco Use  . Smoking status: Never Smoker  . Smokeless tobacco: Never Used  Substance Use Topics  . Alcohol use: Never    ALLERGIES: Not on File   PERTINENT MEDICATIONS:  Outpatient Encounter Medications as of 01/09/2020  Medication Sig  . aspirin EC 81 MG tablet Take 81 mg by mouth daily.  Marland Kitchen atorvastatin (LIPITOR) 80 MG tablet Take 80 mg by mouth at bedtime.   . carbamazepine (TEGRETOL) 200 MG tablet Take 200 mg by mouth in the morning, at noon, and at bedtime.  . clonazePAM (KLONOPIN) 0.5  MG tablet Take 0.5 tablets (0.25 mg total) by mouth in the morning and at bedtime.  . docusate (COLACE) 50 MG/5ML liquid Take 100 mg by mouth daily as needed for mild constipation.  . haloperidol (HALDOL) 5 MG tablet Take 5-7.5 mg by mouth See admin  instructions. Take 5mg  twice daily and 7.5mg  at bedtime.  . memantine (NAMENDA) 10 MG tablet Take 10 mg by mouth 2 (two) times daily.  . Multiple Vitamin (MULTIVITAMIN WITH MINERALS) TABS tablet Take 1 tablet by mouth daily.  Marland Kitchen OLANZapine (ZYPREXA) 5 MG tablet Take 2.5 mg by mouth every 12 (twelve) hours as needed (agitation/aggression).  . traZODone (DESYREL) 100 MG tablet Take 100 mg by mouth at bedtime.   No facility-administered encounter medications on file as of 01/09/2020.    PHYSICAL EXAM:   General: NAD, frail appearing, slender. Sitting at [facility] table but unable to participate in game. Asking to go back to his room. Cardiovascular: regular rate and rhythm Pulmonary: clear posterior fields Abdomen: soft, nontender, + bowel sounds GU: no suprapubic tenderness Extremities: no edema, no joint deformities Skin: no rashes Neurological: Weakness but otherwise non-focal  Julianne Handler, NP

## 2020-01-09 ENCOUNTER — Encounter: Payer: Self-pay | Admitting: Internal Medicine

## 2020-01-09 ENCOUNTER — Other Ambulatory Visit: Payer: Self-pay

## 2020-01-09 ENCOUNTER — Non-Acute Institutional Stay: Payer: Medicare Other | Admitting: Internal Medicine

## 2020-01-09 VITALS — Ht 70.0 in | Wt 156.4 lb

## 2020-01-09 DIAGNOSIS — Z515 Encounter for palliative care: Secondary | ICD-10-CM

## 2020-01-09 DIAGNOSIS — Z7189 Other specified counseling: Secondary | ICD-10-CM

## 2020-01-29 ENCOUNTER — Telehealth: Payer: Self-pay | Admitting: Internal Medicine

## 2020-01-29 NOTE — Telephone Encounter (Signed)
Telephone call from daughter Raymond Snyder. Raymond Snyder wished to update me that , per Dr. Sherri Rad, patient's recent lab values indicated his thyroid functions were "off". Raymond Snyder asked that I communicate to Dr. Sherri Rad that patient had a thyroid biopsy last year through [facility] systems. That biopsy came back "could not rule out malignancy". Family has no wish to pursue workout for possible thyroid cancer, but Raymond Snyder thought Dr. Redmond School should know that part of patient's history. Raymond Snyder plans to obtain the hard copy results of that biopsy and forward this information on to Dr. Pilar Grammes office as well as to [facility]. I provided Raymond Snyder the fax number for each of those spots.  Holly Bodily NP-C  [phone]

## 2020-02-05 ENCOUNTER — Emergency Department (HOSPITAL_COMMUNITY): Payer: Medicare Other

## 2020-02-05 ENCOUNTER — Other Ambulatory Visit: Payer: Self-pay

## 2020-02-05 ENCOUNTER — Emergency Department (HOSPITAL_COMMUNITY)
Admission: EM | Admit: 2020-02-05 | Discharge: 2020-02-05 | Disposition: A | Discharge status: Home / Self Care | Payer: Medicare Other | Attending: Emergency Medicine | Admitting: Emergency Medicine

## 2020-02-05 DIAGNOSIS — Y998 Other external cause status: Secondary | ICD-10-CM | POA: Diagnosis not present

## 2020-02-05 DIAGNOSIS — Y92129 Unspecified place in nursing home as the place of occurrence of the external cause: Secondary | ICD-10-CM | POA: Insufficient documentation

## 2020-02-05 DIAGNOSIS — F0390 Unspecified dementia without behavioral disturbance: Secondary | ICD-10-CM | POA: Diagnosis not present

## 2020-02-05 DIAGNOSIS — W19XXXA Unspecified fall, initial encounter: Secondary | ICD-10-CM | POA: Diagnosis not present

## 2020-02-05 DIAGNOSIS — Y939 Activity, unspecified: Secondary | ICD-10-CM | POA: Diagnosis not present

## 2020-02-05 DIAGNOSIS — S0181XA Laceration without foreign body of other part of head, initial encounter: Secondary | ICD-10-CM

## 2020-02-05 DIAGNOSIS — Z79899 Other long term (current) drug therapy: Secondary | ICD-10-CM | POA: Diagnosis not present

## 2020-02-05 DIAGNOSIS — S0990XA Unspecified injury of head, initial encounter: Secondary | ICD-10-CM | POA: Diagnosis present

## 2020-02-05 LAB — URINALYSIS, ROUTINE W REFLEX MICROSCOPIC
Bilirubin Urine: NEGATIVE
Glucose, UA: NEGATIVE mg/dL
Hgb urine dipstick: NEGATIVE
Ketones, ur: NEGATIVE mg/dL
Leukocytes,Ua: NEGATIVE
Nitrite: NEGATIVE
Protein, ur: NEGATIVE mg/dL
Specific Gravity, Urine: 1.019 (ref 1.005–1.030)
pH: 6 (ref 5.0–8.0)

## 2020-02-05 MED ORDER — HYDROGEN PEROXIDE 3 % EX SOLN
CUTANEOUS | Status: AC
  Filled 2020-02-05: qty 473

## 2020-02-05 NOTE — ED Notes (Signed)
[facility] called for transport.  

## 2020-02-05 NOTE — Discharge Instructions (Addendum)
You were seen today for a fall.  Your work-up is reassuring.  Your laceration was repaired with Steri-Strips and Dermabond.  These will fall off on their own.

## 2020-02-05 NOTE — ED Triage Notes (Signed)
Patient presents from [facility] memory care unit s/p fall. Patient had an unwitnessed fall and was found sitting on a bench. Patient complains of pain at the lac site, no other complaints. Patient with hx dementia. Laceration noted to upper right eye.

## 2020-02-05 NOTE — ED Provider Notes (Signed)
[facility] DEPT Provider Note   CSN: [id number] Arrival date & time: 02/05/20  0113     History Chief Complaint  Patient presents with  . Fall  . Head Laceration    Nakota Elsen is a 79 y.o. male.  HPI     This is a 79 year old male who presents from his living facility with concern for fall.  Per EMS, unwitnessed fall and found sitting on the ground.  Notable laceration to the forehead.  They did not note any other injuries.  Hemodynamically stable in route.  On my evaluation, patient complains of headache.  He denies any other pain.  He is unable to tell me what happened and is disoriented.  He provides minimal history.  Level 5 caveat for dementia  Past Medical History:  Diagnosis Date  . Dementia [facility])     Patient Active Problem List   Diagnosis Date Noted  . Palliative care by specialist   . Goals of care, counseling/discussion   . DNR (do not resuscitate)   . 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 Acute delirium   . Acute encephalopathy 01/02/2020  . Sepsis ([place]) 01/02/2020  . CAP (community acquired pneumonia) 01/01/2020    No past surgical history on file.     Family History  Family history unknown: Yes    Social History   Tobacco Use  . Smoking status: Never Smoker  . Smokeless tobacco: Never Used  Substance Use Topics  . Alcohol use: Never  . Drug use: Never    Home Medications Prior to Admission medications   Medication Sig Start Date End Date Taking? Authorizing Provider  atorvastatin (LIPITOR) 40 MG tablet Take 40 mg by mouth at bedtime.  11/26/19  Yes [provider]  carbamazepine (TEGRETOL) 200 MG tablet Take 200 mg by mouth in the morning, at noon, and at bedtime. 11/26/19  Yes [provider]  clonazePAM (KLONOPIN) 0.5 MG tablet Take 0.5 tablets (0.25 mg total) by mouth in the morning and at bedtime. 01/04/20  Yes Ghimire, Henreitta Leber, MD  docusate (COLACE) 50  MG/5ML liquid Take 100 mg by mouth daily.    Yes [provider]  memantine (NAMENDA) 10 MG tablet Take 10 mg by mouth daily.    Yes [provider]  methimazole (TAPAZOLE) 5 MG tablet Take 5 mg by mouth daily.   Yes [provider]  Multiple Vitamin (MULTIVITAMIN WITH MINERALS) TABS tablet Take 1 tablet by mouth daily.   Yes [provider]  OLANZapine (ZYPREXA) 5 MG tablet Take 5 mg by mouth at bedtime.   Yes [provider]  traZODone (DESYREL) 100 MG tablet Take 100 mg by mouth at bedtime.   Yes [provider]    Allergies    Patient has no known allergies.  Review of Systems   Review of Systems  Unable to perform ROS: Dementia    Physical Exam Updated Vital Signs BP (!) 141/71 (BP Location: Left Arm)   Pulse (!) 55   Temp 97.8 F (36.6 C) (Oral)   Resp 14   SpO2 100%   Physical Exam Vitals and nursing note reviewed.  Constitutional:      Appearance: He is well-developed.     Comments: Chronically ill-appearing but nontoxic  HENT:     Head: Normocephalic.     Comments: 2 cm slightly gaping laceration over the right eyebrow    Nose: Nose normal.  Eyes:  Extraocular Movements: Extraocular movements intact.     Pupils: Pupils are equal, round, and reactive to light.  Cardiovascular:     Rate and Rhythm: Normal rate and regular rhythm.     Heart sounds: Normal heart sounds. No murmur.  Pulmonary:     Effort: Pulmonary effort is normal. No respiratory distress.     Breath sounds: Normal breath sounds. No wheezing.  Abdominal:     General: Bowel sounds are normal.     Palpations: Abdomen is soft.     Tenderness: There is no abdominal tenderness. There is no rebound.     Comments: Ventral hernia noted  Musculoskeletal:     Cervical back: Neck supple.     Comments: Normal range of motion bilateral hips knees, no obvious deformities, no swelling or tenderness, prior scar noted over right knee  Skin:    General:  Skin is warm and dry.     Comments: See HEENT above  Neurological:     Mental Status: He is alert.     Comments: Oriented only to self, follows simple command  Psychiatric:        Mood and Affect: Mood normal.     ED Results / Procedures / Treatments   Labs (all labs ordered are listed, but only abnormal results are displayed) Labs Reviewed  URINALYSIS, ROUTINE W REFLEX MICROSCOPIC    EKG EKG Interpretation  Date/Time:  Wednesday February 05 2020 01:47:12 EDT Ventricular Rate:  66 PR Interval:    QRS Duration: 173 QT Interval:  452 QTC Calculation: 474 R Axis:   -71 Text Interpretation: Sinus rhythm Probable left atrial enlargement RBBB and LAFB Left ventricular hypertrophy No significant change since last tracing Confirmed by Ross Marcus ([id number]) on 02/05/2020 2:21:01 AM   Radiology CT Head Wo Contrast  Result Date: 02/05/2020 CLINICAL DATA:  Unwitnessed fall EXAM: CT HEAD WITHOUT CONTRAST TECHNIQUE: Contiguous axial images were obtained from the base of the skull through the vertex without intravenous contrast. COMPARISON:  05/05/2019 FINDINGS: Brain: There is atrophy and chronic small vessel disease changes. No acute intracranial abnormality. Specifically, no hemorrhage, hydrocephalus, mass lesion, acute infarction, or significant intracranial injury. Vascular: No hyperdense vessel or unexpected calcification. Skull: No acute calvarial abnormality. Sinuses/Orbits: Visualized paranasal sinuses and mastoids clear. Orbital soft tissues unremarkable. Other: None IMPRESSION: Atrophy, chronic microvascular disease. No acute intracranial abnormality. Electronically Signed   By: Charlett Nose M.D.   On: 02/05/2020 02:09   CT Cervical Spine Wo Contrast  Result Date: 02/05/2020 CLINICAL DATA:  Unwitnessed fall EXAM: CT CERVICAL SPINE WITHOUT CONTRAST TECHNIQUE: Multidetector CT imaging of the cervical spine was performed without intravenous contrast. Multiplanar CT image reconstructions  were also generated. COMPARISON:  None. FINDINGS: Alignment: No subluxation. Skull base and vertebrae: No acute fracture. No primary bone lesion or focal pathologic process. Soft tissues and spinal canal: No prevertebral fluid or swelling. No visible canal hematoma. 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 Upper chest: No acute findings Other: None IMPRESSION: Diffuse degenerative disc and facet disease. No acute bony abnormality. Electronically Signed   By: Charlett Nose M.D.   On: 02/05/2020 02:10    Procedures .Marland KitchenLaceration Repair  Date/Time: 02/05/2020 2:50 AM Performed by: Shon Baton, MD Authorized by: Shon Baton, MD   Consent:    Consent obtained:  Verbal   Consent given by:  Patient   Risks discussed:  Pain, poor cosmetic result, poor wound healing and infection   Alternatives discussed:  No treatment  Anesthesia (see MAR for exact dosages):    Anesthesia method:  None Laceration details:    Location:  Face   Face location:  R eyebrow   Length (cm):  2   Depth (mm):  5 Repair type:    Repair type:  Simple Pre-procedure details:    Preparation:  Patient was prepped and draped in usual sterile fashion Exploration:    Wound extent: no areolar tissue violation noted, no fascia violation noted, no foreign bodies/material noted and no muscle damage noted     Contaminated: no   Treatment:    Area cleansed with:  Hibiclens   Amount of cleaning:  Standard   Irrigation solution:  Sterile saline   Visualized foreign bodies/material removed: no   Skin repair:    Repair method:  Tissue adhesive and Steri-Strips   Number of Steri-Strips:  2 Approximation:    Approximation:  Close Post-procedure details:    Dressing:  Open (no dressing)   Patient tolerance of procedure:  Tolerated well, no immediate complications   (including critical care time)  Medications Ordered in ED Medications  hydrogen peroxide 3 % external solution (has no  administration in time range)    ED Course  I have reviewed the triage vital signs and the nursing notes.  Pertinent labs & imaging results that were available during my care of the patient were reviewed by me and considered in my medical decision making (see chart for details).    MDM Rules/Calculators/A&P                       Patient presents after reported unwitnessed fall.  He is overall nontoxic-appearing and ABCs are intact.  Vital signs are reassuring.  He does not provide much history.  He does have a laceration to the right eyebrow which was repaired.  CT head and neck are reassuring without obvious intracranial abnormality or cervical spine fracture.  EKG without acute arrhythmia and urinalysis without obvious infection.  Will discharge back to living facility.  After history, exam, and medical workup I feel the patient has been appropriately medically screened and is safe for discharge home. Pertinent diagnoses were discussed with the patient. Patient was given return precautions.   Final Clinical Impression(s) / ED Diagnoses Final diagnoses:  Facial laceration, initial encounter  Fall, initial encounter    Rx / DC Orders ED Discharge Orders    None       Saren Corkern, Mayer Masker, MD 02/05/20 [phone]

## 2020-02-05 NOTE — ED Notes (Addendum)
Patient unable to sign for transport. Attempted to call [facility] x2, unable to reach staff.

## 2020-02-06 ENCOUNTER — Emergency Department (HOSPITAL_COMMUNITY): Payer: Medicare Other

## 2020-02-06 ENCOUNTER — Other Ambulatory Visit: Payer: Self-pay

## 2020-02-06 ENCOUNTER — Emergency Department (HOSPITAL_COMMUNITY)
Admission: EM | Admit: 2020-02-06 | Discharge: 2020-02-06 | Disposition: A | Discharge status: Home / Self Care | Payer: Medicare Other | Attending: Emergency Medicine | Admitting: Emergency Medicine

## 2020-02-06 ENCOUNTER — Encounter (HOSPITAL_COMMUNITY): Payer: Self-pay

## 2020-02-06 DIAGNOSIS — R319 Hematuria, unspecified: Secondary | ICD-10-CM | POA: Diagnosis present

## 2020-02-06 DIAGNOSIS — N200 Calculus of kidney: Secondary | ICD-10-CM | POA: Insufficient documentation

## 2020-02-06 DIAGNOSIS — F0390 Unspecified dementia without behavioral disturbance: Secondary | ICD-10-CM | POA: Diagnosis not present

## 2020-02-06 HISTORY — DX: Hypotension, unspecified: I95.9

## 2020-02-06 HISTORY — DX: Anemia, unspecified: D64.9

## 2020-02-06 HISTORY — DX: Hyperlipidemia, unspecified: E78.5

## 2020-02-06 LAB — URINALYSIS, ROUTINE W REFLEX MICROSCOPIC
Bilirubin Urine: NEGATIVE
Glucose, UA: NEGATIVE mg/dL
Ketones, ur: NEGATIVE mg/dL
Leukocytes,Ua: NEGATIVE
Nitrite: NEGATIVE
Protein, ur: 100 mg/dL — AB
RBC / HPF: >50 RBC/hpf — ABNORMAL HIGH (ref 0–5)
Specific Gravity, Urine: 1.014 (ref 1.005–1.030)
pH: 6 (ref 5.0–8.0)

## 2020-02-06 MED ORDER — LORAZEPAM 2 MG/ML IJ SOLN: 1.0000 mg | Freq: Once | INTRAMUSCULAR | Status: DC | PRN

## 2020-02-06 MED ORDER — KETOROLAC TROMETHAMINE 60 MG/2ML IM SOLN: 30.0000 mg | Freq: Once | INTRAMUSCULAR | Status: DC

## 2020-02-06 MED ORDER — LORAZEPAM 2 MG/ML IJ SOLN
1.0000 mg | Freq: Once | INTRAMUSCULAR | Status: AC | PRN
  Administered 2020-02-06: 22:00:00 1 mg via INTRAMUSCULAR
  Filled 2020-02-06: qty 1

## 2020-02-06 NOTE — ED Triage Notes (Signed)
Arrived by EMS from [facility]. Facility reports it looks like patient has blood in his urine.

## 2020-02-06 NOTE — ED Notes (Signed)
Patient taken to CT.

## 2020-02-06 NOTE — ED Notes (Signed)
Assisted pt with using the urinal.

## 2020-02-06 NOTE — Discharge Instructions (Signed)
Please make sure to follow up with a urologist early next week. After you see the urologist also see your PCP within 1-2 weeks of discharge. If worsening pain please come back to the emergency room.

## 2020-02-06 NOTE — ED Provider Notes (Signed)
[facility]-EMERGENCY DEPT Provider Note   CSN: [id number] Arrival date & time: 02/06/20  1810     History Chief Complaint  Patient presents with   Hematuria    Raymond Snyder is a 79 y.o. male.  Patient is a 79 year old male with past medical history of dementia presenting for hematuria from his facility, [facility].  Unable to obtain history from patient 2/2 baseline dementia. Attempted to call [facility] but nurse was unavailable to provide history, they plan to call back with further information. Did get in contact with patient's daughter Lorina Rabon who is on the way to the hospital and will provide me further history when she arrives.   Al Decant, Futures trader at [facility] returned call. She reports that today he had frank hematuria in urine. Reports none yesterday. Did not mention that it hurt and did appear in pain. Patient was more aggravated this morning but otherwise at his baseline. This was in the AM but improved in the afternoon. Urine was also darker today. At baseline patient is "a wanderer". He is not alert. Usually calm, usually not physically aggressive. Usually oriented to self only.   Daughter present. Informs me patient did complain of some dysuria and pain. No back pain. Did report frank blood in urine        Past Medical History:  Diagnosis Date   Anemia    Dementia (HCC)    Hyperlipidemia    Hypotension     Patient Active Problem List   Diagnosis Date Noted   Palliative care by specialist    Goals of care, counseling/discussion    DNR (do not resuscitate)    Late onset Alzheimer's disease with behavioral disturbance (HCC)    Acute delirium    Acute encephalopathy 01/02/2020   Sepsis (HCC) 01/02/2020   CAP (community acquired pneumonia) 01/01/2020    No past surgical history on file.     Family History  Family history unknown: Yes    Social History   Tobacco Use   Smoking status: Never  Smoker   Smokeless tobacco: Never Used  Substance Use Topics   Alcohol use: Never   Drug use: Never    Home Medications Prior to Admission medications   Medication Sig Start Date End Date Taking? Authorizing Provider  atorvastatin (LIPITOR) 40 MG tablet Take 40 mg by mouth at bedtime.  11/26/19   [provider]  carbamazepine (TEGRETOL) 200 MG tablet Take 200 mg by mouth in the morning, at noon, and at bedtime. 11/26/19   [provider]  clonazePAM (KLONOPIN) 0.5 MG tablet Take 0.5 tablets (0.25 mg total) by mouth in the morning and at bedtime. 01/04/20   Ghimire, Werner Lean, MD  docusate (COLACE) 50 MG/5ML liquid Take 100 mg by mouth daily.     [provider]  memantine (NAMENDA) 10 MG tablet Take 10 mg by mouth daily.     [provider]  methimazole (TAPAZOLE) 5 MG tablet Take 5 mg by mouth daily.    [provider]  Multiple Vitamin (MULTIVITAMIN WITH MINERALS) TABS tablet Take 1 tablet by mouth daily.    [provider]  OLANZapine (ZYPREXA) 5 MG tablet Take 5 mg by mouth at bedtime.    [provider]  traZODone (DESYREL) 100 MG tablet Take 100 mg by mouth at bedtime.    [provider]    Allergies    Patient has no known allergies.  Review of Systems   Review of Systems  Unable to perform ROS: Dementia    Physical Exam Updated Vital Signs BP 106/64 (BP Location: Right Arm)    Pulse 66    Temp 98.3 F (36.8 C) (Oral)    Resp 16    SpO2 96%   Physical Exam Constitutional:      General: He is not in acute distress. HENT:     Head: Normocephalic.     Comments: Healing laceration noted on right eyebrow    Nose: Nose normal.     Mouth/Throat:     Mouth: Mucous membranes are moist.  Eyes:     Conjunctiva/sclera: Conjunctivae normal.     Pupils: Pupils are equal, round, and reactive to light.  Cardiovascular:     Rate and Rhythm: Normal rate and regular rhythm.     Pulses: Normal pulses.     Heart  sounds: No murmur. No friction rub. No gallop.   Pulmonary:     Effort: Pulmonary effort is normal. No respiratory distress.     Breath sounds: No wheezing, rhonchi or rales.  Abdominal:     General: Bowel sounds are normal.     Tenderness: There is no abdominal tenderness.  Musculoskeletal:        General: Normal range of motion.  Skin:    General: Skin is warm.     Findings: No rash.  Neurological:     Mental Status: He is alert. He is disoriented.     Comments: Oriented to person only     ED Results / Procedures / Treatments   Labs (all labs ordered are listed, but only abnormal results are displayed) Labs Reviewed  URINALYSIS, ROUTINE W REFLEX MICROSCOPIC - Abnormal; Notable for the following components:      Result Value   Color, Urine BROWN (*)    APPearance TURBID (*)    Hgb urine dipstick LARGE (*)    Protein, ur 100 (*)    RBC / HPF >50 (*)    Bacteria, UA RARE (*)    All other components within normal limits  URINE CULTURE    EKG None  Radiology CT ABDOMEN PELVIS WO CONTRAST  Result Date: 02/06/2020 CLINICAL DATA:  Hematuria EXAM: CT ABDOMEN AND PELVIS WITHOUT CONTRAST TECHNIQUE: Multidetector CT imaging of the abdomen and pelvis was performed following the standard protocol without IV contrast. COMPARISON:  None. FINDINGS: Lower chest: Clustered nodules noted in the right lower lobe, the largest 9 mm. Left base clear. No effusions. Hepatobiliary: No focal hepatic abnormality. Gallbladder unremarkable. Pancreas: No focal abnormality or ductal dilatation. Spleen: No focal abnormality.  Normal size. Adrenals/Urinary Tract: 3 cm low-density lesion in the midpole of the right kidney, likely cyst. Other smaller low-density lesions in the right kidney. 5 mm calcification/stone at the right UPJ. Mild right hydronephrosis. No stones or hydronephrosis on the left. Urinary bladder decompressed. Adrenal glands unremarkable. Stomach/Bowel: Large stool burden throughout the  colon, particularly rectosigmoid colon raising the possibility of fecal impaction. Stomach and small bowel decompressed. 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 Aneurysm sac size 5 cm maximally. No adenopathy. Reproductive: Prostate enlargement Other: No free fluid or free air. Musculoskeletal: No acute bony abnormality. IMPRESSION: 5 mm right UVJ stone with mild right hydronephrosis. Several low-density lesions within the right kidney, the largest 3 cm, likely cysts. 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 Aneurysm sac size 5 cm currently. Large stool burden in the colon, particularly rectosigmoid colon. Question fecal impaction. Clustered nodules in the right lung base  measuring up to 9 mm. Clustered nature of these suggests an infectious or inflammatory process. These could be followed with chest CT in 3-6 months to ensure resolution. Electronically Signed   By: Rolm Baptise M.D.   On: 02/06/2020 22:30   CT Head Wo Contrast  Result Date: 02/05/2020 CLINICAL DATA:  Unwitnessed fall EXAM: CT HEAD WITHOUT CONTRAST TECHNIQUE: Contiguous axial images were obtained from the base of the skull through the vertex without intravenous contrast. COMPARISON:  05/05/2019 FINDINGS: Brain: There is atrophy and chronic small vessel disease changes. No acute intracranial abnormality. Specifically, no hemorrhage, hydrocephalus, mass lesion, acute infarction, or significant intracranial injury. Vascular: No hyperdense vessel or unexpected calcification. Skull: No acute calvarial abnormality. Sinuses/Orbits: Visualized paranasal sinuses and mastoids clear. Orbital soft tissues unremarkable. Other: None IMPRESSION: Atrophy, chronic microvascular disease. No acute intracranial abnormality. Electronically Signed   By: Rolm Baptise M.D.   On: 02/05/2020 02:09   CT Cervical Spine Wo Contrast  Result Date: 02/05/2020 CLINICAL DATA:  Unwitnessed fall EXAM: CT CERVICAL SPINE WITHOUT  CONTRAST TECHNIQUE: Multidetector CT imaging of the cervical spine was performed without intravenous contrast. Multiplanar CT image reconstructions were also generated. COMPARISON:  None. FINDINGS: Alignment: No subluxation. Skull base and vertebrae: No acute fracture. No primary bone lesion or focal pathologic process. Soft tissues and spinal canal: No prevertebral fluid or swelling. No visible canal hematoma. 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 Upper chest: No acute findings Other: None IMPRESSION: Diffuse degenerative disc and facet disease. No acute bony abnormality. Electronically Signed   By: Rolm Baptise M.D.   On: 02/05/2020 02:10    Procedures Procedures (including critical care time)  Medications Ordered in ED Medications  ketorolac (TORADOL) injection 30 mg (has no administration in time range)  LORazepam (ATIVAN) injection 1 mg (1 mg Intramuscular Given 02/06/20 2140)    ED Course  I have reviewed the triage vital signs and the nursing notes.  Pertinent labs & imaging results that were available during my care of the patient were reviewed by me and considered in my medical decision making (see chart for details).    MDM Rules/Calculators/A&P                      Unable to obtain history given dementia. Per EMS patient had hematuria. Did not have pain to palpation of abdomen. Will get UA and Cx. Of note was in ED yesterday at which time UA was negative.   20:24 Patient with frank blood per nursing staff and patient's daughter. Given frank blood will get CT abdomen hematuria w/u to r/o causes such as kidney stones.   21:31 Patient unable to stay still for CT 2/2 agitation. Discussed with Dr. Vanita Panda. Will give 1mg  ativan PRN prior to CT  22:45 CT showing "5 mm right UVJ stone with mild right hydronephrosis. Several low-density lesions within the right kidney, the largest 3 cm, likely cysts. Prior stent graft repair of abdominal aortic  aneurysm. Aneurysm sac size 5 cm currently. Large stool burden in the colon, particularly rectosigmoid colon. Question fecal impaction. Clustered nodules in the right lung base measuring up to 9 mm. Clustered nature of these suggests an infectious or inflammatory process. These could be followed with chest CT in 3-6 months to ensure resolution."  62mm stone likely the cause of hematuria. Discussed with Dr. Vanita Panda who also reviewed CT. Given size, will likely pass on its own. Will give IM toradol and plan to discharge  with close follow up with urology. Discussed with daughter at bedside who is agreeable with plan. Advised to increase fluid intake to assist with stone passage.   Of note, CT did show nodules in right lung base and recommended follow up with chest CT in 3-6 months. Should be followed by PCP.   Final Clinical Impression(s) / ED Diagnoses Final diagnoses:  Hematuria  Hematuria, unspecified type  Kidney stone    Rx / DC Orders ED Discharge Orders    None       Oralia Manis, DO 02/06/20 2256    Gerhard Munch, MD 02/09/20 1820

## 2020-02-08 LAB — URINE CULTURE: Culture: <10000 — AB

## 2020-02-13 ENCOUNTER — Encounter: Payer: Self-pay | Admitting: Internal Medicine

## 2020-02-13 ENCOUNTER — Other Ambulatory Visit: Payer: Self-pay

## 2020-02-13 ENCOUNTER — Non-Acute Institutional Stay: Payer: Medicare Other | Admitting: Internal Medicine

## 2020-02-13 DIAGNOSIS — Z7189 Other specified counseling: Secondary | ICD-10-CM

## 2020-02-13 DIAGNOSIS — Z515 Encounter for palliative care: Secondary | ICD-10-CM

## 2020-02-13 NOTE — Progress Notes (Signed)
April 29th, 2021 [facility] Palliative Care Consult Note Telephone: [phone]  Fax: [phone]   PATIENT NAME: Raymond Snyder DOB: Sep 01, 1941 MRN: [id number] [facility] [id number]) Move in date 11/23/2019   PRIMARY CARE PROVIDER:   Dr. Florentina Snyder (O) [phone], [phone]   REFERRING PROVIDER:  No referring provider defined for this encounter.   RESPONSIBLE PARTY:   Raymond Snyder, Raymond Snyder (Daughter/HCPOA) [phone] (Mobile). [email or web address]    ASSESSMENT / RECOMMENDATIONS:  1. Advance Care Planning:  A. Directives: (completed in hospital) Forms in facility chart. DNR. MOST: DNR/DNI, Limited interventions. IVFs and Antibiotics: determine at the time / limited time trial. No feeding tube.   B. Goals of Care: For patient to be a happy and healthy within the facility as is possible. Daughter Raymond Snyder considering transfer to home at some point in the future. I advised her she may wish to wait if/when patient becomes bedbound, d/t wandering/incontinence issues. He does have a f/u urology visit scheduled nxt week for kidney stone (ER visit 6 days earlier).   2. Cognitive / Functional status: FAST 7b (from 7a). Staff report further cognitive decline and weight loss over these last 5 weeks. Constantly confused/disoriented. Occasionally responds to his name. Speech is totally off topic. No longer following simple commands. Wanders about aimlessly and almost constantly with very unsteady gait and is unable to be redirected to sit/rest. Speech is a word salad. No longer maintaining eye contact. No aggressive behaviors. Can't find his way about memory care unit.  Weight on 02/06/2011 was 140.8lbs; a decrease of 15.6lbs (10% of his body weight) over the previous 5 weeks. At a height of 5'10" his BMI is 20.2kg/m2. He is currently on nectar thickened liquids and mechanical soft diet d/t prior episode aspiration pneumonia.   Patient ambulates and transfers  without assistance. He wanders with an a very unsteady gait. He is unable to cognate use of cane or walker. He is dependent for hygiene and dressing. One week earlier  ER visit for R eyebrow laceration after unwitnessed fall. He is usually incontinent of urine; urinates into inappropriate spots as well as into the toilet. Wanders into other rooms and uses their toilet. Can still feed himself.   3. Family Supports: I spoke with (dtr/HCPOA) Raymond Snyder.She was able to visit the patient in the ER at the time of his last 2 visits. We discussed the possible trajectory of patient's decline, in view of his marked weight loss, and further cognitive decline. We discussed scope of hospice services, and that patient would be eligible. Raymond Snyder asks for hospice referral.   4. Follow up: will seek hospice referral; message left with Dr. Pilar Grammes office, with our contact information.    I spent 60 minutes providing this consultation from 9am to 10am. More than 50% of the time in this consultation was spent coordinating communication.   HISTORY OF PRESENT ILLNESS:  Raymond Snyder is a 79 y.o. male with h/o dementia (facility memory care). Past h/o behaviors (agitation) with past admissions to [facility] (bad s/e to [place] in past). -3/17-3/20/2021: possible sepsis 2/2 aspiration pneumonia, dysphagia (SLP regular diet), encephalopathy (improved) -02/05/2020 ER visit for R eyebrow laceration after unwitnessed fall. CT head/neck without acute trauma. -02/06/2020 ER visit gross hematuria. CT with kidney stone. Plan urology f/u next week. This is a f/u Palliative Care visit from 01/09/2020.    CODE STATUS: DNR   PPS: weak 40% HOSPICE ELIGIBILITY/DIAGNOSIS: yes/weight loss, protein calorie malnutrition.   PAST  MEDICAL HISTORY:  Past Medical History:  Diagnosis Date  . Anemia   . Dementia ([place])   . Hyperlipidemia   . Hypotension     SOCIAL HX:  Social History   Tobacco Use  . Smoking status:  Never Smoker  . Smokeless tobacco: Never Used  Substance Use Topics  . Alcohol use: Never    ALLERGIES: No Known Allergies   PERTINENT MEDICATIONS:  Outpatient Encounter Medications as of 02/13/2020  Medication Sig  . atorvastatin (LIPITOR) 40 MG tablet Take 40 mg by mouth at bedtime.   . carbamazepine (TEGRETOL) 200 MG tablet Take 200 mg by mouth in the morning, at noon, and at bedtime.  . clonazePAM (KLONOPIN) 0.5 MG tablet Take 0.5 tablets (0.25 mg total) by mouth in the morning and at bedtime.  . docusate (COLACE) 50 MG/5ML liquid Take 100 mg by mouth daily.   . memantine (NAMENDA) 10 MG tablet Take 10 mg by mouth daily.   . methimazole (TAPAZOLE) 5 MG tablet Take 5 mg by mouth daily.  . Multiple Vitamin (MULTIVITAMIN WITH MINERALS) TABS tablet Take 1 tablet by mouth daily.  Marland Kitchen OLANZapine (ZYPREXA) 5 MG tablet Take 5 mg by mouth at bedtime.  . traZODone (DESYREL) 100 MG tablet Take 100 mg by mouth at bedtime.   No facility-administered encounter medications on file as of 02/13/2020.    PHYSICAL EXAM:   Elderly, frail, very thin, stooped over and wandering about the hallways with very unsteady gait. Occasionally bending over to pick up unseen items from the floor. He mentions he is wet, but I am unable to direct him to his room or get him to sit down. He appears fatigued. Not maintaining eye contact. Unable follow simple commands.  Cardiovascular: regular rate and rhythm Pulmonary: clear anterior fields Abdomen: soft, nontender, + bowel sounds GU: no suprapubic tenderness Extremities: no edema, no joint deformities Skin: no rashes Neurological: Weakness but otherwise non-focal  Julianne Handler, NP

## 2020-06-17 DEATH — deceased

## 2020-06-26 ENCOUNTER — Encounter (HOSPITAL_BASED_OUTPATIENT_CLINIC_OR_DEPARTMENT_OTHER): Payer: Medicare Other | Admitting: Internal Medicine

## 2020-12-22 IMAGING — CT CT CERVICAL SPINE W/O CM
3 of 4 series · 9 of 33 positions shown, 11 images · non-contrast
Comparison: None.

CLINICAL DATA: Unwitnessed fall

EXAM:
CT CERVICAL SPINE WITHOUT CONTRAST
TECHNIQUE: Multidetector CT imaging of the cervical spine was performed without
intravenous contrast. Multiplanar CT image reconstructions were also
generated.

[Series 5: orthogonal bone · axial · 0.19mm/px · z∈[-152,-152]mm · 1 of 96 slices shown, 2 images]
[im 48/96  soft-tissue]
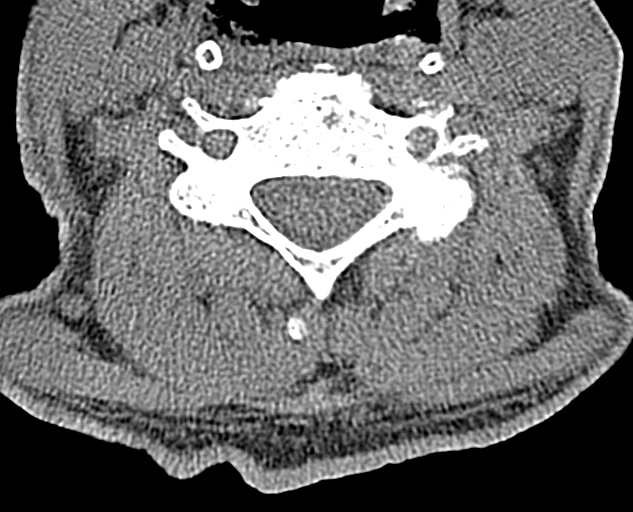
[im 48/96  bone]
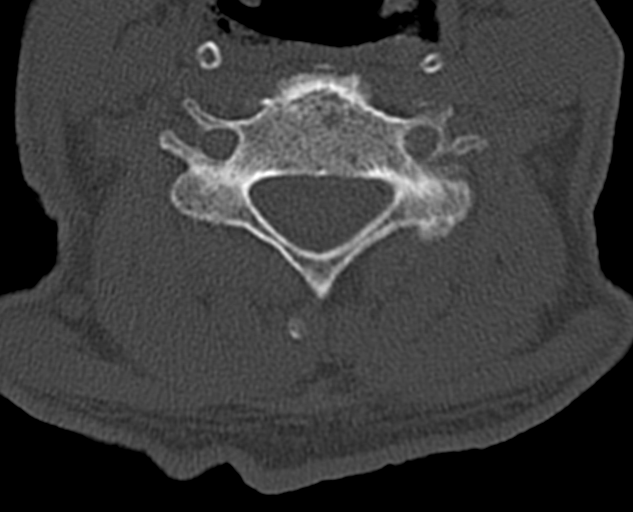

[Series 6: coronal bone · coronal · 0.20mm/px · 3 of 50 slices shown]
[im 10/50  bone]
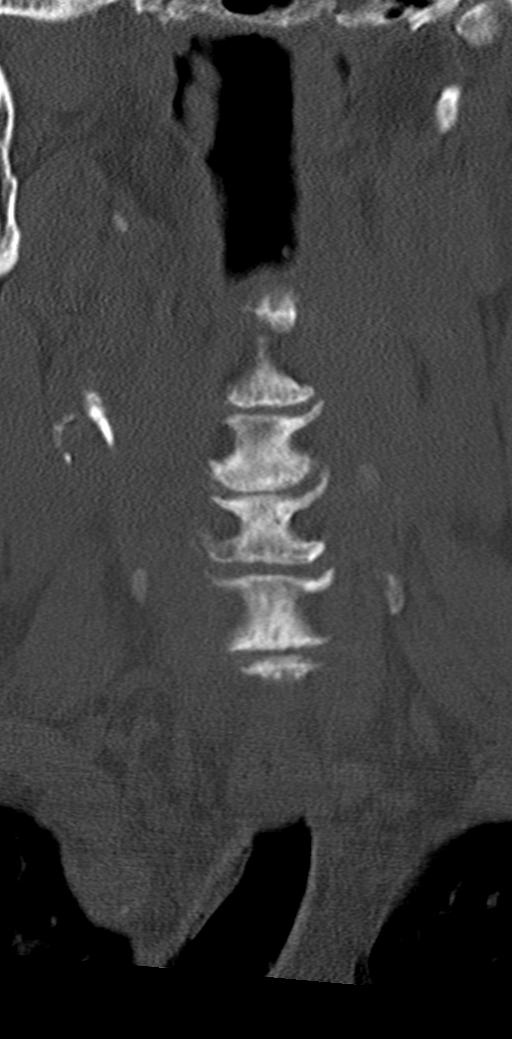
[im 20/50  bone]
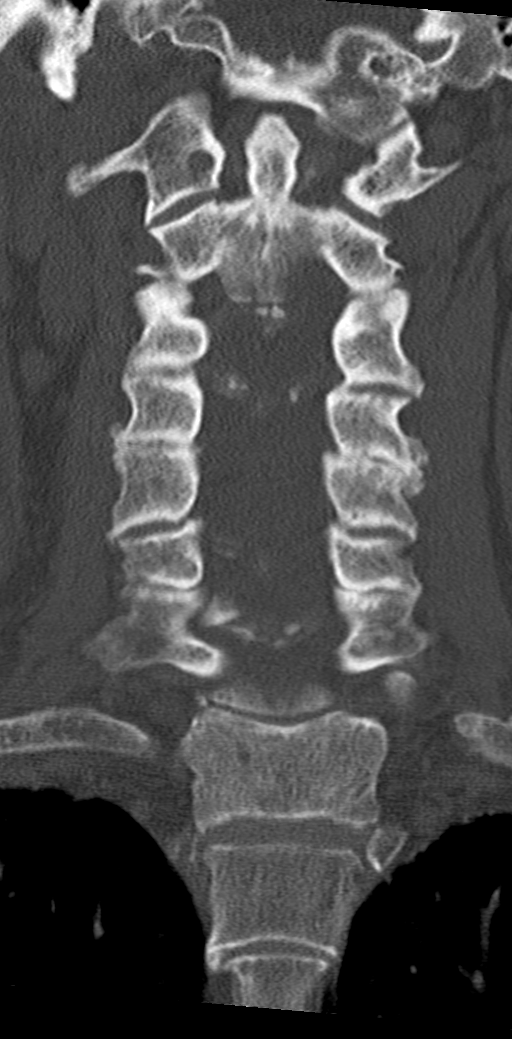
[im 30/50  bone]
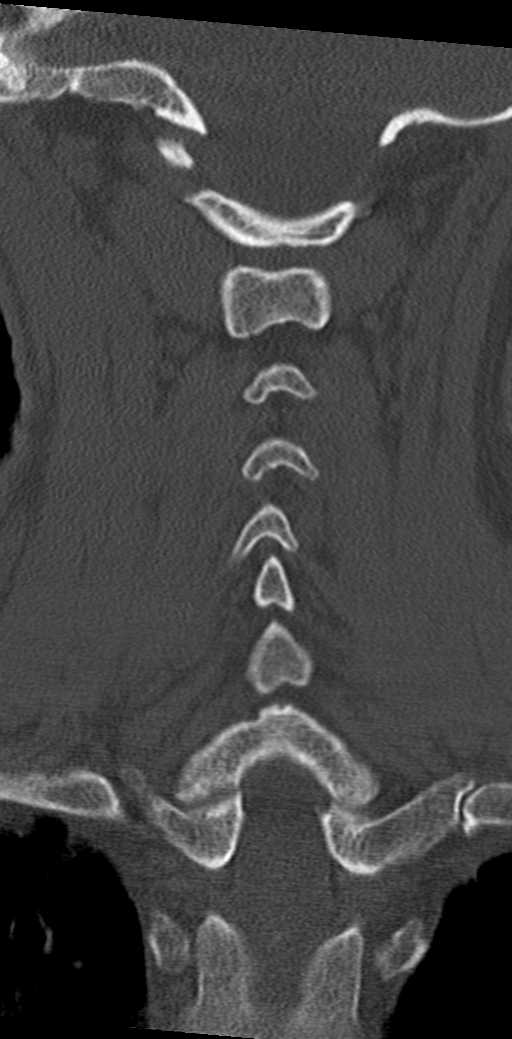

[Series 7: sagittal bone · sagittal · 0.19mm/px · 5 of 61 slices shown, 6 images]
[im 21/61  bone]
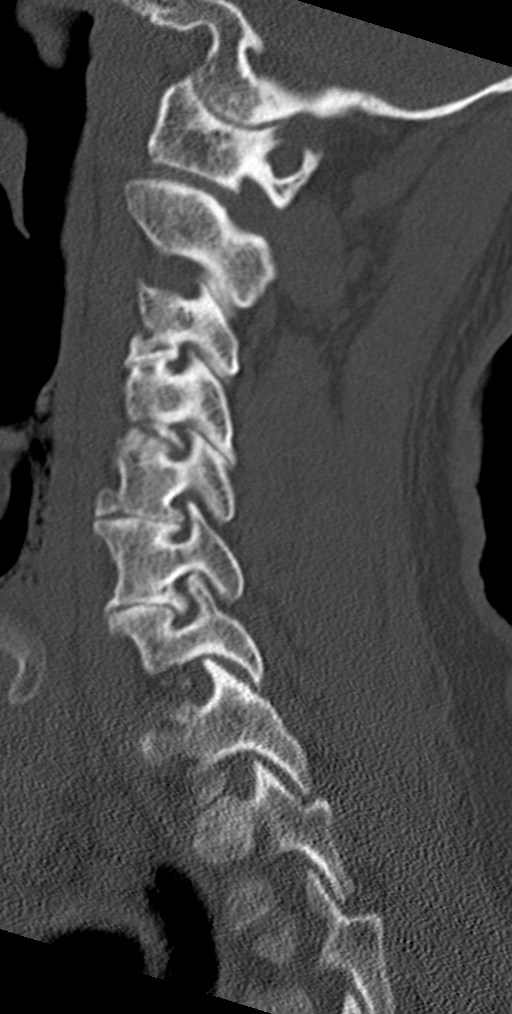
[im 26/61  bone]
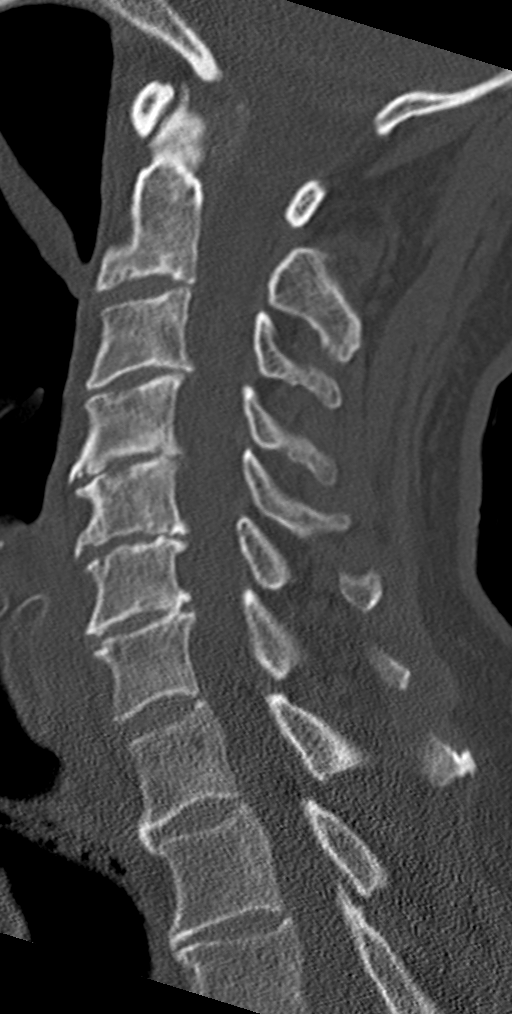
[im 31/61  soft-tissue]
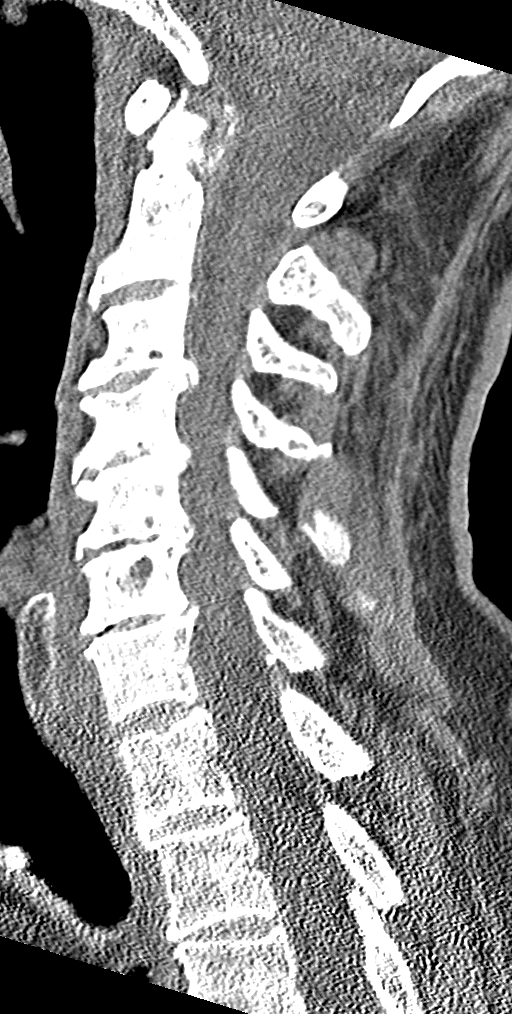
[im 31/61  bone]
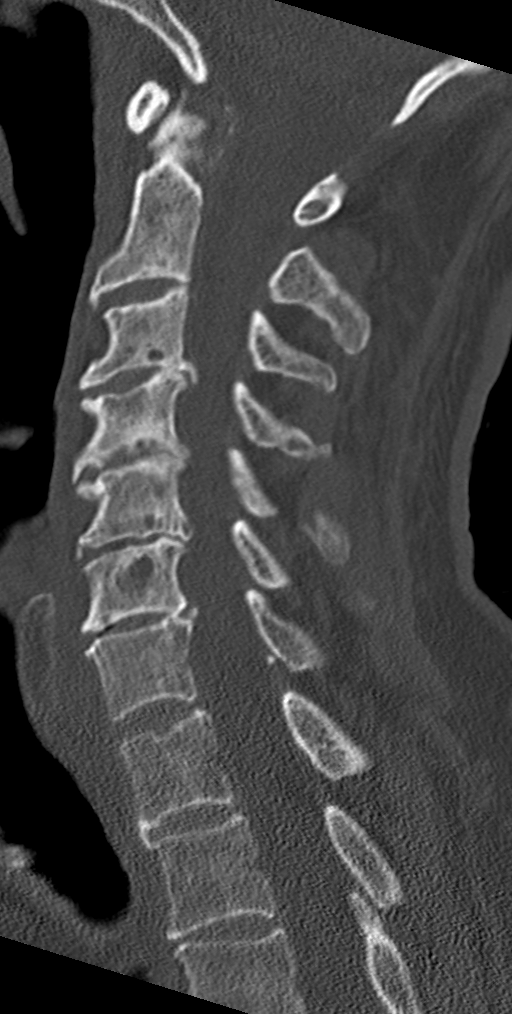
[im 36/61  bone]
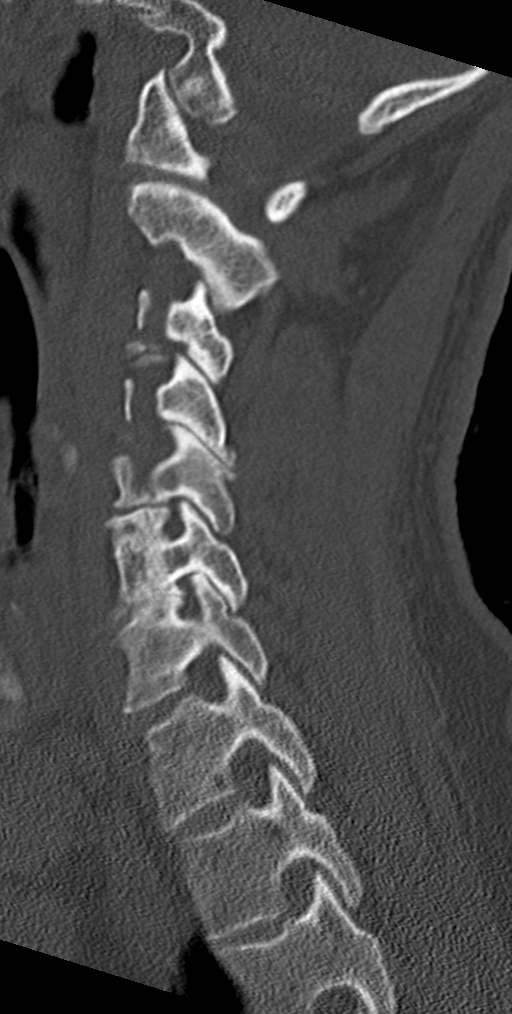
[im 41/61  bone]
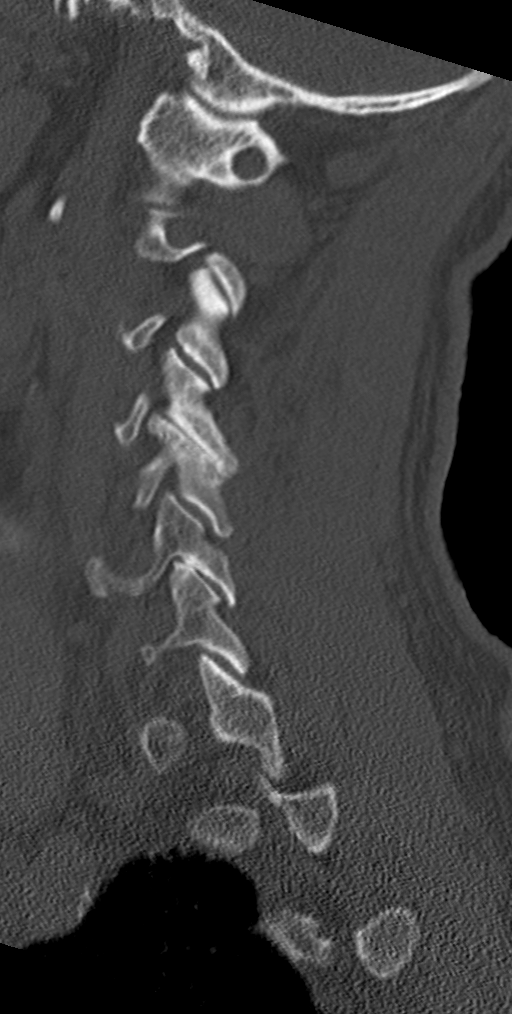

[9 of 33 positions shown; findings below may reference images not displayed]

FINDINGS: Alignment: No subluxation.

Skull base and vertebrae: No acute fracture. No primary bone lesion
or focal pathologic process.

Soft tissues and spinal canal: No prevertebral fluid or swelling. No
visible canal hematoma.

Disc levels:  Diffuse degenerative disc disease and facet disease.

Upper chest: No acute findings

Other: None
IMPRESSION: Diffuse degenerative disc and facet disease. No acute bony
abnormality.
